# Patient Record
Sex: Male | Born: 1960 | Race: White | Hispanic: No | Marital: Single | Smoking: Never smoker
Health system: Southern US, Community
[De-identification: ages and names within clinical notes are randomized; demographics above are authoritative.]

## PROBLEM LIST (undated history)

## (undated) DIAGNOSIS — K449 Diaphragmatic hernia without obstruction or gangrene: Secondary | ICD-10-CM

## (undated) DIAGNOSIS — G4730 Sleep apnea, unspecified: Secondary | ICD-10-CM

## (undated) DIAGNOSIS — E785 Hyperlipidemia, unspecified: Secondary | ICD-10-CM

## (undated) DIAGNOSIS — K222 Esophageal obstruction: Secondary | ICD-10-CM

## (undated) DIAGNOSIS — K219 Gastro-esophageal reflux disease without esophagitis: Secondary | ICD-10-CM

## (undated) DIAGNOSIS — I493 Ventricular premature depolarization: Secondary | ICD-10-CM

## (undated) DIAGNOSIS — K635 Polyp of colon: Secondary | ICD-10-CM

## (undated) DIAGNOSIS — E669 Obesity, unspecified: Secondary | ICD-10-CM

## (undated) DIAGNOSIS — K5790 Diverticulosis of intestine, part unspecified, without perforation or abscess without bleeding: Secondary | ICD-10-CM

## (undated) DIAGNOSIS — G4733 Obstructive sleep apnea (adult) (pediatric): Secondary | ICD-10-CM

## (undated) HISTORY — DX: Diaphragmatic hernia without obstruction or gangrene: K44.9

## (undated) HISTORY — DX: Gastro-esophageal reflux disease without esophagitis: K21.9

## (undated) HISTORY — PX: OTHER SURGICAL HISTORY: SHX169

## (undated) HISTORY — DX: Ventricular premature depolarization: I49.3

## (undated) HISTORY — DX: Sleep apnea, unspecified: G47.30

## (undated) HISTORY — DX: Obesity, unspecified: E66.9

## (undated) HISTORY — DX: Obstructive sleep apnea (adult) (pediatric): G47.33

## (undated) HISTORY — DX: Polyp of colon: K63.5

## (undated) HISTORY — DX: Hyperlipidemia, unspecified: E78.5

## (undated) HISTORY — DX: Esophageal obstruction: K22.2

## (undated) HISTORY — DX: Diverticulosis of intestine, part unspecified, without perforation or abscess without bleeding: K57.90

---

## 2002-02-23 ENCOUNTER — Encounter: Payer: Self-pay | Admitting: *Deleted

## 2002-02-23 ENCOUNTER — Encounter: Admission: RE | Admit: 2002-02-23 | Discharge: 2002-02-23 | Payer: Self-pay | Admitting: *Deleted

## 2003-02-24 ENCOUNTER — Encounter: Admission: RE | Admit: 2003-02-24 | Discharge: 2003-02-24 | Payer: Self-pay | Admitting: *Deleted

## 2003-02-24 ENCOUNTER — Encounter: Payer: Self-pay | Admitting: *Deleted

## 2005-07-02 ENCOUNTER — Ambulatory Visit: Payer: Self-pay | Admitting: Gastroenterology

## 2006-02-18 ENCOUNTER — Ambulatory Visit: Payer: Self-pay | Admitting: Gastroenterology

## 2006-02-21 ENCOUNTER — Ambulatory Visit: Payer: Self-pay | Admitting: Gastroenterology

## 2007-10-14 ENCOUNTER — Ambulatory Visit (HOSPITAL_BASED_OUTPATIENT_CLINIC_OR_DEPARTMENT_OTHER): Admission: RE | Admit: 2007-10-14 | Discharge: 2007-10-14 | Payer: Self-pay | Admitting: Orthopedic Surgery

## 2007-10-14 ENCOUNTER — Encounter (INDEPENDENT_AMBULATORY_CARE_PROVIDER_SITE_OTHER): Payer: Self-pay | Admitting: Orthopedic Surgery

## 2007-11-13 ENCOUNTER — Encounter: Admission: RE | Admit: 2007-11-13 | Discharge: 2007-11-13 | Payer: Self-pay | Admitting: *Deleted

## 2010-12-12 NOTE — Op Note (Signed)
NAME:  Ptacek, Lonell                 ACCOUNT NO.:  [id number]   MEDICAL RECORD NO.:  [id number]          PATIENT TYPE:  AMB   LOCATION:  DSC                          FACILITY:  [facility]   PHYSICIAN:  Katy Fitch. Sypher, M.D. DATE OF BIRTH:  Dec 28, 1960   DATE OF PROCEDURE:  10/14/2007  DATE OF DISCHARGE:                               OPERATIVE REPORT   PREOPERATIVE DIAGNOSIS:  Mass palmar aspect right index finger PIP  flexion crease, rule out epidermal inclusion cyst versus early  Dupuytren's fibromatosis.   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   OPERATION:  Excisional biopsy of mass right index finger PIP flexion  crease.   INDICATIONS:  Raymie Giammarco is a 50 year old gentleman referred for  evaluation of a mass in the palmar aspect of his right index finger.  He  has  a positive family history of Dupuytren's disease affecting his  father.  He noted this mass and watched it enlarge during the past  several months. He requested an excisional biopsy for diagnosis.  Inspection of his hand revealed no other signs of palmar fibromatosis.  He could not recall any penetrating injury to this region.  After  informed consent, he is brought to the minor operating room for biopsy.   PROCEDURE:  Krystal Delduca is brought to the minor operating room and  placed in the supine position on the operating table.  Following  informed consent, a 2% lidocaine metacarpal head level block was placed  without complication.  The right hand was then prepped with Betadine  soap solution and sterilely draped.  A pneumatic tourniquet was applied  to the proximal brachium.  Following exsanguination of the limb by  direct compression, the arterial tourniquet was inflated to 240 mmHg.  The procedure commenced with an elliptical excision of skin overlying  the mass.  The subcutaneous tissues were carefully divided revealing a  very firm mass of fibrotic tissue.  This was carefully dissected off the  flexor  sheath protecting the neurovascular bundles.  The entire mass was  sent for pathologic evaluation in formalin.  The wound was then repaired  with a simple suture of 5-0 nylon.   For aftercare, Mr. Compean was placed in a compressive dressing with  Xeroflow, sterile gauze, and Coban.  He is provided additional Coban to  change his dressing at home as needed.  We will him see back in follow  up in one week for suture removal.  At that time, we will discuss his  pathologic findings.      Katy Fitch Sypher, M.D.  Electronically Signed     RVS/MEDQ  D:  10/14/2007  T:  10/14/2007  Job:  [id number]

## 2011-03-14 ENCOUNTER — Encounter: Payer: Self-pay | Admitting: Gastroenterology

## 2011-03-20 ENCOUNTER — Other Ambulatory Visit: Payer: Self-pay | Admitting: Family Medicine

## 2011-03-20 DIAGNOSIS — M5412 Radiculopathy, cervical region: Secondary | ICD-10-CM

## 2011-03-22 ENCOUNTER — Ambulatory Visit
Admission: RE | Admit: 2011-03-22 | Discharge: 2011-03-22 | Disposition: A | Discharge status: Home / Self Care | Payer: 59 | Source: Ambulatory Visit | Attending: Family Medicine | Admitting: Family Medicine

## 2011-03-22 DIAGNOSIS — M5412 Radiculopathy, cervical region: Secondary | ICD-10-CM

## 2011-08-20 ENCOUNTER — Encounter: Payer: Self-pay | Admitting: Internal Medicine

## 2011-09-14 ENCOUNTER — Encounter: Payer: Self-pay | Admitting: Internal Medicine

## 2011-09-14 ENCOUNTER — Ambulatory Visit (INDEPENDENT_AMBULATORY_CARE_PROVIDER_SITE_OTHER): Payer: 59 | Admitting: Internal Medicine

## 2011-09-14 VITALS — BP 138/74 | HR 60 | Ht 74.0 in | Wt 226.4 lb

## 2011-09-14 DIAGNOSIS — Z8371 Family history of colonic polyps: Secondary | ICD-10-CM

## 2011-09-14 DIAGNOSIS — Z800 Family history of malignant neoplasm of digestive organs: Secondary | ICD-10-CM

## 2011-09-14 DIAGNOSIS — K222 Esophageal obstruction: Secondary | ICD-10-CM

## 2011-09-14 DIAGNOSIS — Z86010 Personal history of colonic polyps: Secondary | ICD-10-CM

## 2011-09-14 DIAGNOSIS — K219 Gastro-esophageal reflux disease without esophagitis: Secondary | ICD-10-CM

## 2011-09-14 MED ORDER — PEG-KCL-NACL-NASULF-NA ASC-C 100 G PO SOLR: 1.0000 | Freq: Once | ORAL | Status: DC

## 2011-09-14 NOTE — Progress Notes (Signed)
HISTORY OF PRESENT ILLNESS:  Jerry Barry is a 51 y.o. male with a history of gastroesophageal reflux disease complicated by peptic stricture and colon polyps. The patient presents today regarding the desire for surveillance colonoscopy as well as questions regarding management of his reflux disease. He was previously a patient of Dr. Terrial Rhodes. Review of the previous records shows that he has undergone prior upper endoscopies with esophageal dilation. He is maintained on Prilosec 10 mg every other day. On this dose to PPI he has good control of reflux symptoms and no recurrent dysphagia. Off medication, he reports incapacitating heartburn. He does have concerns regarding long-term use of PPI. In particular, bone density issues. He tells me that he has had bone density scan showing some decreased bone density which is being addressed by his physician. Next, he has a family history of colon cancer in a grandparent. Adenomatous colon polyps in his father at age 9. Sister with "40 polyps". His index complete colonoscopy in July of 2007 revealed left-sided diverticulosis, and a diminutive transverse colon polyp which was destroyed with cauterization. No tissue available for pathologic analysis. His recall colonoscopy came out this summer. His chart was reviewed by one of my colleagues, upon Dr. Blossom Hoops retirement. The recall as modified to 2017. Patient is concerned based on his history, and politely questions this recommendation. He denies any lower GI complaints such as change in bowel habits or bleeding.  REVIEW OF SYSTEMS:  All non-GI ROS assessed and reported to be negative.  Past Medical History  Diagnosis Date  . GERD (gastroesophageal reflux disease)   . Hiatal hernia   . Esophageal stricture   . Colon polyps   . Diverticulosis   . Hemorrhoids     Past Surgical History  Procedure Date  . Arm fx     right arm , 6 th grade     Social History Jerry Barry  reports that he has  never smoked. He has never used smokeless tobacco. He reports that he drinks alcohol. He reports that he does not use illicit drugs.  family history includes Benign prostatic hyperplasia in his father; Colitis in his mother; Colon cancer in his maternal grandfather; Colon polyps in his brother, father, and sister; Diabetes in his father; and Heart disease in his mother.  No Known Allergies     PHYSICAL EXAMINATION: Vital signs: BP 138/74  Pulse 60  Ht 6\' 2"  (1.88 m)  Wt 226 lb 6.4 oz (102.694 kg)  BMI 29.07 kg/m2  Constitutional: generally well-appearing, no acute distress Psychiatric: alert and oriented x3, cooperative Eyes: extraocular movements intact, anicteric, conjunctiva pink Mouth: oral pharynx moist, no lesions Neck: supple no lymphadenopathy Cardiovascular: heart regular rate and rhythm, no murmur Lungs: clear to auscultation bilaterally Abdomen: soft, nontender, nondistended, no obvious ascites, no peritoneal signs, normal bowel sounds, no organomegaly Rectal:deferred until colonoscopy Extremities: no lower extremity edema bilaterally Skin: no lesions on visible extremities Neuro: No focal deficits.   ASSESSMENT:  #1. GERD complicated by peptic stricture. Doing well on low-dose PPI. We discussed the issues surrounding bone density and PPI therapy. I reviewed with him the data available at this time. His circumstance (complicated reflux disease) she would be best served continuing low-dose PPI and having his bone density monitored with his physician. This can be treated accordingly. He didn't require about antireflux surgery. We discussed the pros and cons of reflux surgery. He will continue on PPI therapy and reflux precautions. #2. Family history of colon cancer second  degree relative. History of adenomatous polyps in 2 first-degree relatives at an early age #3. Personal history of colon polyp. Type unknown. Greater than 5 years since his last procedure. He is interested in  proceeding with surveillance colonoscopy. I think this is reasonable given his personal history and family history.The nature of the procedure, as well as the risks, benefits, and alternatives were carefully and thoroughly reviewed with the patient. Ample time for discussion and questions allowed. The patient understood, was satisfied, and agreed to proceed. Movi prep prescribed. The patient instructed on it's use

## 2011-09-14 NOTE — Patient Instructions (Addendum)
You have been scheduled for a colonoscopy with propofol. Please follow written instructions given to you at your visit today.  Please pick up your prep kit at the pharmacy within the next 1-3 days.  

## 2011-09-17 ENCOUNTER — Encounter: Payer: Self-pay | Admitting: Internal Medicine

## 2011-09-24 ENCOUNTER — Encounter: Payer: 59 | Admitting: Internal Medicine

## 2011-10-23 ENCOUNTER — Encounter: Payer: Self-pay | Admitting: Internal Medicine

## 2011-10-23 ENCOUNTER — Ambulatory Visit (AMBULATORY_SURGERY_CENTER): Payer: 59 | Admitting: Internal Medicine

## 2011-10-23 VITALS — BP 119/78 | HR 71 | Temp 98.2°F | Resp 18 | Ht 74.0 in | Wt 226.0 lb

## 2011-10-23 DIAGNOSIS — Z800 Family history of malignant neoplasm of digestive organs: Secondary | ICD-10-CM

## 2011-10-23 DIAGNOSIS — Z86010 Personal history of colonic polyps: Secondary | ICD-10-CM

## 2011-10-23 DIAGNOSIS — Z1211 Encounter for screening for malignant neoplasm of colon: Secondary | ICD-10-CM

## 2011-10-23 MED ORDER — SODIUM CHLORIDE 0.9 % IV SOLN: 500.0000 mL | INTRAVENOUS | Status: DC

## 2011-10-23 NOTE — Progress Notes (Signed)
Patient did not experience any of the following events: a burn prior to discharge; a fall within the facility; wrong site/side/patient/procedure/implant event; or a hospital transfer or hospital admission upon discharge from the facility. (G8907) Patient did not have preoperative order for IV antibiotic SSI prophylaxis. (G8918)  

## 2011-10-23 NOTE — Progress Notes (Deleted)
Zofran 2mg . In 8 ml of normal saline x2 given over 10 minutes. [id number])

## 2011-10-23 NOTE — Patient Instructions (Signed)

## 2011-10-23 NOTE — Op Note (Signed)
[facility] [address] [place], [place]  [address]  COLONOSCOPY PROCEDURE REPORT  PATIENT:  Jerry, Barry  MR#:  [id number] BIRTHDATE:  [birth year], 51 yrs. old  GENDER:  male ENDOSCOPIST:  Wilhemina Bonito. Eda Keys, MD REF. BY:  Office PROCEDURE DATE:  10/23/2011 PROCEDURE:  Surveillance Colonoscopy ASA CLASS:  Class II INDICATIONS:  history of polyps, family history of colon cancer, family Hx of polyps ; last exam 01-2006 (small polyp - no path); GP w/ CRC; Father w/ adenomas < 60 yrs; sister with multiple adenomas  MEDICATIONS:   MAC sedation, administered by CRNA, propofol (Diprivan) 300 mg IV  DESCRIPTION OF PROCEDURE:   After the risks benefits and alternatives of the procedure were thoroughly explained, informed consent was obtained.  Digital rectal exam was performed and revealed no abnormalities.   The LB CF-[id number] endoscope was introduced through the anus and advanced to the cecum, which was identified by both the appendix and ileocecal valve, without limitations.  The quality of the prep was excellent, using MoviPrep.  The instrument was then slowly withdrawn as the colon was fully examined. <<PROCEDUREIMAGES>>  FINDINGS:  A normal appearing cecum, ileocecal valve, and appendiceal orifice were identified. The ascending, hepatic flexure, transverse, splenic flexure, descending, sigmoid colon, and rectum appeared unremarkable.  No polyps or cancers were seen. Retroflexed views in the rectum revealed no abnormalities.  The time to cecum = 3:30  minutes. The scope was then withdrawn in 8:19  minutes from the cecum and the procedure completed.  COMPLICATIONS:  None  ENDOSCOPIC IMPRESSION: 1) Normal colon 2) No polyps or cancers  RECOMMENDATIONS: 1) Follow up colonoscopy in 5 years  ______________________________ Wilhemina Bonito. Eda Keys, MD  CC:  Gildardo Cranker, MD; The Patient  n. eSIGNED:   Wilhemina Bonito. Eda Keys at 10/23/2011 02:24 PM  Eugenia Mcalpine,  [phone]

## 2011-10-24 ENCOUNTER — Telehealth: Payer: Self-pay | Admitting: *Deleted

## 2011-10-24 NOTE — Telephone Encounter (Signed)
Left message on number given yesterday to call if needs anything or has questions or problems. ewm

## 2011-12-26 ENCOUNTER — Other Ambulatory Visit: Payer: Self-pay | Admitting: Family Medicine

## 2011-12-26 ENCOUNTER — Ambulatory Visit
Admission: RE | Admit: 2011-12-26 | Discharge: 2011-12-26 | Disposition: A | Discharge status: Home / Self Care | Payer: 59 | Source: Ambulatory Visit | Attending: Family Medicine | Admitting: Family Medicine

## 2011-12-26 DIAGNOSIS — M5126 Other intervertebral disc displacement, lumbar region: Secondary | ICD-10-CM

## 2012-03-20 DIAGNOSIS — Z8042 Family history of malignant neoplasm of prostate: Secondary | ICD-10-CM | POA: Insufficient documentation

## 2012-03-20 DIAGNOSIS — N400 Enlarged prostate without lower urinary tract symptoms: Secondary | ICD-10-CM | POA: Insufficient documentation

## 2013-03-23 DIAGNOSIS — N529 Male erectile dysfunction, unspecified: Secondary | ICD-10-CM | POA: Insufficient documentation

## 2014-01-04 DIAGNOSIS — N411 Chronic prostatitis: Secondary | ICD-10-CM | POA: Insufficient documentation

## 2014-04-19 DIAGNOSIS — R002 Palpitations: Secondary | ICD-10-CM | POA: Insufficient documentation

## 2014-05-06 ENCOUNTER — Encounter: Payer: Self-pay | Admitting: *Deleted

## 2014-05-26 ENCOUNTER — Ambulatory Visit (INDEPENDENT_AMBULATORY_CARE_PROVIDER_SITE_OTHER): Payer: 59 | Admitting: Cardiology

## 2014-05-26 ENCOUNTER — Encounter: Payer: Self-pay | Admitting: Cardiology

## 2014-05-26 VITALS — BP 118/84 | HR 68 | Ht 74.0 in | Wt 216.0 lb

## 2014-05-26 DIAGNOSIS — R002 Palpitations: Secondary | ICD-10-CM

## 2014-05-26 DIAGNOSIS — R0789 Other chest pain: Secondary | ICD-10-CM

## 2014-05-26 LAB — BASIC METABOLIC PANEL
BUN: 14 mg/dL (ref 6–23)
CALCIUM: 9.2 mg/dL (ref 8.4–10.5)
CO2: 28 meq/L (ref 19–32)
CREATININE: 1.1 mg/dL (ref 0.4–1.5)
Chloride: 103 mEq/L (ref 96–112)
GFR: 77.18 mL/min (ref >60.00)
GLUCOSE: 79 mg/dL (ref 70–99)
Potassium: 3.7 mEq/L (ref 3.5–5.1)
SODIUM: 139 meq/L (ref 135–145)

## 2014-05-26 LAB — CBC
HCT: 45.7 % (ref 39.0–52.0)
Hemoglobin: 15.2 g/dL (ref 13.0–17.0)
MCHC: 33.3 g/dL (ref 30.0–36.0)
MCV: 89.3 fl (ref 78.0–100.0)
Platelets: 250 10*3/uL (ref 150.0–400.0)
RBC: 5.11 Mil/uL (ref 4.22–5.81)
RDW: 13.2 % (ref 11.5–15.5)
WBC: 6.9 10*3/uL (ref 4.0–10.5)

## 2014-05-26 LAB — TSH: TSH: 1.27 u[IU]/mL (ref 0.35–4.50)

## 2014-05-26 NOTE — Progress Notes (Signed)
[name]. [address], [address], [place]  [address] Phone: [phone] Fax:  [phone]  Date:  05/26/2014   ID:  Jerry Barry, DOB [birth year], MRN [id number]  PCP:   Melinda Crutch, MD   History of Present Illness: Jerry Barry is a 53 y.o. male here for the evaluation of palpitations.  Never previously had any cardiovascular issues (I took care of his mother Jerry Barry who unfortunately passed in 2014). He was at the [facility] area show, hot day, sweated and on the drive home began to feel his heart racing, uneasy feeling. He checked his pulse, carotid pulse and it seemed to be fast, slightly irregular at 150 bpm. He pulled over at gas station, drink a full Gatorade and water back-to-back and after approximately 30 minutes this feeling subsided. He then had another episode which woke him up from sleep where his heart felt like it was racing. He felt a uneasy feeling over his left chest, mild pressure-like sensation. Prior to these episodes, he was exercising at the gym on treadmill for 45 minutes able to achieve a heart rate of 130 bpm without any difficulty. He is also felt a sensation of a singular skipping beat, PVC like as well in the evening hours. He has cut out all caffeine, cut out alcohol on the weekend, stopped his vitamin B supplement.  Once the event is gone, he feels fine. He is quite anxious about these episodes. These episodes lasted about 30 minutes. Nonsmoker, no prior cardiac history. No early family history of coronary artery disease.   Wt Readings from Last 3 Encounters:  05/26/14 216 lb (97.977 kg)  10/23/11 226 lb (102.513 kg)  09/14/11 226 lb 6.4 oz (102.694 kg)     Past Medical History  Diagnosis Date  . GERD (gastroesophageal reflux disease)   . Hiatal hernia   . Esophageal stricture   . Colon polyps   . Diverticulosis   . Hemorrhoids     Past Surgical History  Procedure Laterality Date  . Arm fx      right arm , 6 th grade     Current  Outpatient Prescriptions  Medication Sig Dispense Refill  . B Complex Vitamins (B COMPLEX PO) as directed      . calcium carbonate 200 MG capsule Take 250 mg by mouth 2 (two) times daily with a meal.      . MULTIPLE VITAMIN PO Take by mouth.      . Omega-3 Fatty Acids (FISH OIL) 1000 MG CAPS Take by mouth.      Marland Kitchen omeprazole (PRILOSEC) 10 MG capsule Take 10 mg by mouth every other day.       No current facility-administered medications for this visit.    Allergies:   No Known Allergies  Social History:  The patient  reports that he has never smoked. He has never used smokeless tobacco. He reports that he drinks alcohol. He reports that he does not use illicit drugs.   Family History  Problem Relation Age of Onset  . Colon polyps Father   . Diabetes Father   . Benign prostatic hyperplasia Father   . Colon cancer Maternal Grandfather   . Colon polyps Sister   . Colon polyps Brother   . Heart disease Mother     stint  . Colitis Mother   . Esophageal cancer Neg Hx   . Rectal cancer Neg Hx   . Stomach cancer Neg Hx  ROS:  Please see the history of present illness.   Denies any syncope, bleeding, orthopnea, PND, strokelike symptoms   All other systems reviewed and negative.   PHYSICAL EXAM: VS:  BP 118/84  Pulse 68  Ht 6\' 2"  (1.88 m)  Wt 216 lb (97.977 kg)  BMI 27.72 kg/m2 Well nourished, well developed, in no acute distress HEENT: normal, [place]/AT, EOMI Neck: no JVD, normal carotid upstroke, no bruit Cardiac:  normal S1, S2; RRR; no murmur Lungs:  clear to auscultation bilaterally, no wheezing, rhonchi or rales Abd: soft, nontender, no hepatomegaly, no bruits Ext: no edema, 2+ distal pulses Skin: warm and dry GU: deferred Neuro: no focal abnormalities noted, AAO x 3  EKG:  05/26/14-sinus rhythm, 68, no other abnormalities.     ASSESSMENT AND PLAN:  1. Palpitations-I will check an event monitor. Differential includes atrial fibrillation, paroxysmal atrial tachycardia,  premature ventricular contractions. Continue to avoid caffeine, Sudafed, excessive alcohol. I will check a TSH, basic metabolic profile and CBC. I would like to gather a diagnosis first before empirically treating. We may use low-dose beta blocker or calcium channel blocker in the future. 2. Mild chest pressure-I will check an echocardiogram to ensure proper structure and function of his heart. Prior to these episodes, he was exercising well without any anginal symptoms. If chest discomfort returns, we will likely consider exercise treadmill test. 3. I will see him back in 6 weeks. He knows to call me if any worrisome symptoms develop.  Signed, Candee Furbish, MD [facility]  05/26/2014 3:10 PM

## 2014-05-26 NOTE — Patient Instructions (Addendum)
Your physician recommends that you continue on your current medications as directed. Please refer to the Current Medication list given to you today. Your physician recommends that you return for lab work today (TSH, BMET, CBC)  Your physician has requested that you have an echocardiogram. Echocardiography is a painless test that uses sound waves to create images of your heart. It provides your doctor with information about the size and shape of your heart and how well your heart's chambers and valves are working. This procedure takes approximately one hour. There are no restrictions for this procedure.  Your physician has recommended that you wear an event monitor. Event monitors are medical devices that record the heart's electrical activity. Doctors most often [place] these monitors to diagnose arrhythmias. Arrhythmias are problems with the speed or rhythm of the heartbeat. The monitor is a small, portable device. You can wear one while you do your normal daily activities. This is usually used to diagnose what is causing palpitations/syncope (passing out).  Your physician recommends that you schedule a follow-up appointment in: ABOUT 6 WEEKS WITH DR. Marlou Porch.

## 2014-05-27 ENCOUNTER — Ambulatory Visit (HOSPITAL_COMMUNITY): Payer: 59 | Attending: Cardiology | Admitting: Radiology

## 2014-05-27 ENCOUNTER — Encounter: Payer: Self-pay | Admitting: *Deleted

## 2014-05-27 ENCOUNTER — Encounter (INDEPENDENT_AMBULATORY_CARE_PROVIDER_SITE_OTHER): Payer: 59

## 2014-05-27 DIAGNOSIS — R002 Palpitations: Secondary | ICD-10-CM

## 2014-05-27 DIAGNOSIS — R0789 Other chest pain: Secondary | ICD-10-CM | POA: Diagnosis not present

## 2014-05-27 NOTE — Progress Notes (Signed)
Echocardiogram performed.  

## 2014-05-27 NOTE — Progress Notes (Signed)
Patient ID: Jerry Barry, male   DOB: Jan 24, 1961, 53 y.o.   MRN: [id number] [employer] 30 day cardiac event monitor applied to patient.

## 2014-06-12 ENCOUNTER — Telehealth: Payer: Self-pay | Admitting: Internal Medicine

## 2014-06-12 NOTE — Telephone Encounter (Signed)
Called from [place] that his monitor recorded an episode of atrial fibrillation. It is currently in SR.

## 2014-06-14 ENCOUNTER — Telehealth: Payer: Self-pay | Admitting: *Deleted

## 2014-06-14 NOTE — Telephone Encounter (Signed)
Reviewed event monitor results with pt who is agreeable to come in on Wednesday to review in detail along with treatment options.  His appt is for 9 am

## 2014-06-16 ENCOUNTER — Ambulatory Visit (INDEPENDENT_AMBULATORY_CARE_PROVIDER_SITE_OTHER): Payer: 59 | Admitting: Cardiology

## 2014-06-16 ENCOUNTER — Ambulatory Visit (INDEPENDENT_AMBULATORY_CARE_PROVIDER_SITE_OTHER)
Admission: RE | Admit: 2014-06-16 | Discharge: 2014-06-16 | Disposition: A | Discharge status: Home / Self Care | Payer: Self-pay | Source: Ambulatory Visit | Attending: Cardiology | Admitting: Cardiology

## 2014-06-16 ENCOUNTER — Encounter: Payer: Self-pay | Admitting: Cardiology

## 2014-06-16 VITALS — BP 128/82 | HR 73 | Ht 74.0 in | Wt 215.0 lb

## 2014-06-16 DIAGNOSIS — R0789 Other chest pain: Secondary | ICD-10-CM

## 2014-06-16 DIAGNOSIS — I480 Paroxysmal atrial fibrillation: Secondary | ICD-10-CM | POA: Insufficient documentation

## 2014-06-16 MED ORDER — DILTIAZEM HCL ER COATED BEADS 120 MG PO CP24: 120.0000 mg | ORAL_CAPSULE | Freq: Every day | ORAL | Status: DC

## 2014-06-16 NOTE — Progress Notes (Signed)
[place]. [address] [place], [place]  [address] Phone: [phone] Fax:  [phone]  Date:  06/16/2014   ID:  Jerry Barry, DOB 01/18/1961, MRN [id number]  PCP:   Melinda Crutch, MD   History of Present Illness: Jerry Barry is a 53 y.o. male here for follow-up, monitor which demonstrated paroxysmal atrial fibrillation. TSH normal, electrolytes normal. CHADS-Vasc -0. See below for description of original symptoms in October 2015.   Never previously had any cardiovascular issues (I took care of his mother Jerry Barry who unfortunately passed in 2014). He was at the [facility] area show, hot day, sweated and on the drive home began to feel his heart racing, uneasy feeling. He checked his pulse, carotid pulse and it seemed to be fast, slightly irregular at 150 bpm. He pulled over at gas station, drink a full Gatorade and water back-to-back and after approximately 30 minutes this feeling subsided. He then had another episode which woke him up from sleep where his heart felt like it was racing. He felt a uneasy feeling over his left chest, mild pressure-like sensation. Prior to these episodes, he was exercising at the gym on treadmill for 45 minutes able to achieve a heart rate of 130 bpm without any difficulty. He is also felt a sensation of a singular skipping beat, PVC like as well in the evening hours. He has cut out all caffeine, cut out alcohol on the weekend, stopped his vitamin B supplement.  Once the event is gone, he feels fine. He is quite anxious about these episodes. These episodes lasted about 30 minutes. Nonsmoker, no prior cardiac history. No early family history of coronary artery disease.   Wt Readings from Last 3 Encounters:  06/16/14 215 lb (97.523 kg)  05/26/14 216 lb (97.977 kg)  10/23/11 226 lb (102.513 kg)     Past Medical History  Diagnosis Date  . GERD (gastroesophageal reflux disease)   . Hiatal hernia   . Esophageal stricture   . Colon polyps   .  Diverticulosis   . Hemorrhoids     Past Surgical History  Procedure Laterality Date  . Arm fx      right arm , 6 th grade     Current Outpatient Prescriptions  Medication Sig Dispense Refill  . B Complex Vitamins (B COMPLEX PO) as directed    . MULTIPLE VITAMIN PO Take by mouth.    Marland Kitchen omeprazole (PRILOSEC) 10 MG capsule Take 10 mg by mouth every other day.     No current facility-administered medications for this visit.    Allergies:   No Known Allergies  Social History:  The patient  reports that he has never smoked. He has never used smokeless tobacco. He reports that he drinks alcohol. He reports that he does not use illicit drugs.   Family History  Problem Relation Age of Onset  . Colon polyps Father   . Diabetes Father   . Benign prostatic hyperplasia Father   . Colon cancer Maternal Grandfather   . Colon polyps Sister   . Colon polyps Brother   . Heart disease Mother     stint  . Colitis Mother   . Esophageal cancer Neg Hx   . Rectal cancer Neg Hx   . Stomach cancer Neg Hx     ROS:  Please see the history of present illness.   Denies any syncope, bleeding, orthopnea, PND, strokelike symptoms   All other systems reviewed and negative.  PHYSICAL EXAM: VS:  BP 128/82 mmHg  Pulse 73  Ht 6\' 2"  (1.88 m)  Wt 215 lb (97.523 kg)  BMI 27.59 kg/m2 Well nourished, well developed, in no acute distress HEENT: normal, [place]/AT, EOMI Neck: no JVD, normal carotid upstroke, no bruit Cardiac:  normal S1, S2; RRR; no murmur Lungs:  clear to auscultation bilaterally, no wheezing, rhonchi or rales Abd: soft, nontender, no hepatomegaly, no bruits Ext: no edema, 2+ distal pulses Skin: warm and dry GU: deferred Neuro: no focal abnormalities noted, AAO x 3  EKG:  05/26/14-sinus rhythm, 68, no other abnormalities.     Echocardiogram: 05/27/14 - Left ventricle: The cavity size was normal. Systolic function was vigorous. The estimated ejection fraction was in the range of  65% to 70%. Wall motion was normal; there were no regional wall motion abnormalities. Left ventricular diastolic function parameters were normal. - Aortic valve: Trileaflet; normal thickness leaflets. There was no regurgitation. - Aortic root: The aortic root was normal in size. - Left atrium: The atrium was mildly dilated. - Right ventricle: Systolic function was normal. - Right atrium: The atrium was normal in size. - Tricuspid valve: There was mild regurgitation. - Pulmonary arteries: Systolic pressure was within the normal range. - Inferior vena cava: The vessel was normal in size. - Pericardium, extracardiac: There was no pericardial effusion.  ASSESSMENT AND PLAN:  1. Paroxysmal atrial fibrillation-detected on monitor 06/12/14 at 12 AM with heart rate maximum 120 bpm. Continue to avoid caffeine, Sudafed, excessive alcohol. We will go ahead and initiate Cardizem CD 120 mg once a day. Next step would be to add flecainide. We discussed the physiology behind atrial fibrillation. Ablation we discussed. CHADS-Vasc is 0. No anticoagulation needed. Low-dose aspirin is reasonable. We discussed consideration of sleep study. Stress reduction, breathing techniques.  2. Mild chest pressure-Prior to these episodes, he was exercising well without any anginal symptoms. If chest discomfort returns, we will likely consider exercise treadmill test. He is interested in calcium scoring. We will go ahead and order this for him. He understands there is an out-of-pocket cost for this. 3. I will see him back in 4 weeks. He knows to call me if any worrisome symptoms develop.  Signed, Candee Furbish, MD [facility]  06/16/2014 9:26 AM

## 2014-06-16 NOTE — Patient Instructions (Signed)
Please start Diltiazem CD 120 mg once a day. Continue all other medications as listed.  Your physician has requested that you have cardiac CT. Cardiac computed tomography (CT) is a painless test that uses an x-ray machine to take clear, detailed pictures of your heart. For further information please visit [facility]. Please follow instruction sheet as given.  Think about the sleep study and let us know when you are ready to have it done.  Follow up in 1 month with Dr Marlou Porch.

## 2014-07-09 ENCOUNTER — Telehealth: Payer: Self-pay | Admitting: Cardiology

## 2014-07-09 ENCOUNTER — Other Ambulatory Visit: Payer: Self-pay | Admitting: *Deleted

## 2014-07-09 ENCOUNTER — Encounter: Payer: Self-pay | Admitting: Cardiology

## 2014-07-09 ENCOUNTER — Ambulatory Visit (INDEPENDENT_AMBULATORY_CARE_PROVIDER_SITE_OTHER): Payer: 59 | Admitting: Cardiology

## 2014-07-09 VITALS — BP 128/88 | HR 71 | Ht 74.0 in | Wt 218.0 lb

## 2014-07-09 DIAGNOSIS — I4891 Unspecified atrial fibrillation: Secondary | ICD-10-CM

## 2014-07-09 DIAGNOSIS — I480 Paroxysmal atrial fibrillation: Secondary | ICD-10-CM

## 2014-07-09 DIAGNOSIS — R0683 Snoring: Secondary | ICD-10-CM

## 2014-07-09 MED ORDER — DILTIAZEM HCL ER COATED BEADS 240 MG PO CP24: 240.0000 mg | ORAL_CAPSULE | Freq: Every day | ORAL | Status: DC

## 2014-07-09 NOTE — Telephone Encounter (Signed)
Walk In pt Form " Sleep Study" Question Please call pt gave to Pam/KM

## 2014-07-09 NOTE — Progress Notes (Signed)
[facility]. [address] [place], [place]  [address] Phone: [phone] Fax:  [phone]  Date:  07/09/2014   ID:  Jerry Barry, DOB 08-Dec-1960, MRN [id number]  PCP:   Melinda Crutch, MD   History of Present Illness: Jerry Barry is a 53 y.o. male here for follow-up, monitor which demonstrated paroxysmal atrial fibrillation. TSH normal, electrolytes normal. CHADS-Vasc -0. See below for description of original symptoms in October 2015.   Never previously had any cardiovascular issues (I took care of his mother Jerry Barry who unfortunately passed in 2014). He was at the [facility] area show, hot day, sweated and on the drive home began to feel his heart racing, uneasy feeling. He checked his pulse, carotid pulse and it seemed to be fast, slightly irregular at 150 bpm. He pulled over at gas station, drink a full Gatorade and water back-to-back and after approximately 30 minutes this feeling subsided. He then had another episode which woke him up from sleep where his heart felt like it was racing. He felt a uneasy feeling over his left chest, mild pressure-like sensation. Prior to these episodes, he was exercising at the gym on treadmill for 45 minutes able to achieve a heart rate of 130 bpm without any difficulty. He is also felt a sensation of a singular skipping beat, PVC like as well in the evening hours. He has cut out all caffeine, cut out alcohol on the weekend, stopped his vitamin B supplement.  Once the event is gone, he feels fine. He is quite anxious about these episodes. These episodes lasted about 30 minutes. Nonsmoker, no prior cardiac history. No early family history of coronary artery disease.  He is still feeling some episodes after starting low-dose diltiazem.   Wt Readings from Last 3 Encounters:  07/09/14 218 lb (98.884 kg)  06/16/14 215 lb (97.523 kg)  05/26/14 216 lb (97.977 kg)     Past Medical History  Diagnosis Date  . GERD (gastroesophageal reflux disease)     . Hiatal hernia   . Esophageal stricture   . Colon polyps   . Diverticulosis   . Hemorrhoids     Past Surgical History  Procedure Laterality Date  . Arm fx      right arm , 6 th grade     Current Outpatient Prescriptions  Medication Sig Dispense Refill  . B Complex Vitamins (B COMPLEX PO) as directed    . diltiazem (CARDIZEM CD) 120 MG 24 hr capsule Take 1 capsule (120 mg total) by mouth daily. 30 capsule 3  . fluorometholone (FML) 0.1 % ophthalmic suspension     . MULTIPLE VITAMIN PO Take by mouth.    Marland Kitchen omeprazole (PRILOSEC) 10 MG capsule Take 10 mg by mouth every other day.    . trimethoprim-polymyxin b (POLYTRIM) ophthalmic solution   0   No current facility-administered medications for this visit.    Allergies:   No Known Allergies  Social History:  The patient  reports that he has never smoked. He has never used smokeless tobacco. He reports that he drinks alcohol. He reports that he does not use illicit drugs.   Family History  Problem Relation Age of Onset  . Colon polyps Father   . Diabetes Father   . Benign prostatic hyperplasia Father   . Colon cancer Maternal Grandfather   . Colon polyps Sister   . Colon polyps Brother   . Heart disease Mother     stint  .  Colitis Mother   . Esophageal cancer Neg Hx   . Rectal cancer Neg Hx   . Stomach cancer Neg Hx     ROS:  Please see the history of present illness.   Denies any syncope, bleeding, orthopnea, PND, strokelike symptoms   All other systems reviewed and negative.   PHYSICAL EXAM: VS:  BP 128/88 mmHg  Pulse 71  Ht 6\' 2"  (1.88 m)  Wt 218 lb (98.884 kg)  BMI 27.98 kg/m2 Well nourished, well developed, in no acute distress HEENT: normal, [place]/AT, EOMI Neck: no JVD, normal carotid upstroke, no bruit Cardiac:  normal S1, S2; RRR; no murmur Lungs:  clear to auscultation bilaterally, no wheezing, rhonchi or rales Abd: soft, nontender, no hepatomegaly, no bruits Ext: no edema, 2+ distal pulses Skin: warm and  dry GU: deferred Neuro: no focal abnormalities noted, AAO x 3  EKG:  05/26/14-sinus rhythm, 68, no other abnormalities.     Echocardiogram: 05/27/14 - Left ventricle: The cavity size was normal. Systolic function was vigorous. The estimated ejection fraction was in the range of 65% to 70%. Wall motion was normal; there were no regional wall motion abnormalities. Left ventricular diastolic function parameters were normal. - Aortic valve: Trileaflet; normal thickness leaflets. There was no regurgitation. - Aortic root: The aortic root was normal in size. - Left atrium: The atrium was mildly dilated. - Right ventricle: Systolic function was normal. - Right atrium: The atrium was normal in size. - Tricuspid valve: There was mild regurgitation. - Pulmonary arteries: Systolic pressure was within the normal range. - Inferior vena cava: The vessel was normal in size. - Pericardium, extracardiac: There was no pericardial effusion.  ASSESSMENT AND PLAN:  1. Paroxysmal atrial fibrillation-detected on monitor 06/12/14 at 12 AM with heart rate maximum 120 bpm. Continue to avoid caffeine, Sudafed, excessive alcohol. In fact, he thinks that alcohol may be a trigger. He had a glass of wine the other night and felt more palpitations than usual. We will go ahead and increase Cardizem CD 120 mg once a day to 240 mg. Next step would be to add flecainide. We discussed the physiology behind atrial fibrillation. Ablation we discussed. CHADS-Vasc is 0. No anticoagulation needed. Low-dose aspirin is reasonable. We discussed consideration of sleep study. Stress reduction, breathing techniques.  2. Mild chest pressure-Prior to these episodes, he was exercising well without any anginal symptoms. If chest discomfort returns, we will likely consider exercise treadmill test. Calcium scoring-0. 3. I will see him back in 4 months. He knows to call me if any worrisome symptoms develop.  Signed, Candee Furbish,  MD [facility]  07/09/2014 9:17 AM

## 2014-07-09 NOTE — Patient Instructions (Signed)
Please increase your Diltiazem CD to 240 mg a day. Continue all other medications as listed.  Follow up in 4 months with Dr. Marlou Porch.  You will receive a letter in the mail 2 months before you are due.  Please call us when you receive this letter to schedule your follow up appointment.  Thank you for choosing [facility]!!

## 2014-09-28 ENCOUNTER — Ambulatory Visit (HOSPITAL_BASED_OUTPATIENT_CLINIC_OR_DEPARTMENT_OTHER): Payer: 59 | Attending: Cardiology | Admitting: Radiology

## 2014-09-28 VITALS — Ht 74.0 in | Wt 210.0 lb

## 2014-09-28 DIAGNOSIS — I4891 Unspecified atrial fibrillation: Secondary | ICD-10-CM | POA: Diagnosis not present

## 2014-09-28 DIAGNOSIS — G4733 Obstructive sleep apnea (adult) (pediatric): Secondary | ICD-10-CM

## 2014-09-28 DIAGNOSIS — R0683 Snoring: Secondary | ICD-10-CM

## 2014-10-04 ENCOUNTER — Encounter (HOSPITAL_BASED_OUTPATIENT_CLINIC_OR_DEPARTMENT_OTHER): Payer: Self-pay | Admitting: Radiology

## 2014-10-04 ENCOUNTER — Telehealth: Payer: Self-pay | Admitting: Cardiology

## 2014-10-04 DIAGNOSIS — G4733 Obstructive sleep apnea (adult) (pediatric): Secondary | ICD-10-CM

## 2014-10-04 HISTORY — DX: Obstructive sleep apnea (adult) (pediatric): G47.33

## 2014-10-04 NOTE — Telephone Encounter (Signed)
Please let patient know that he has mild OSA and please set up OV with me to discuss options for treatment.

## 2014-10-04 NOTE — Sleep Study (Signed)
   NAME: KHYRAN RIERA DATE OF BIRTH:  12-Feb-1961 MEDICAL RECORD NUMBER [id number]  LOCATION: [facility]  PHYSICIAN: Gissele Narducci R  DATE OF STUDY: 09/28/2014  SLEEP STUDY TYPE: Nocturnal Polysomnogram               REFERRING PHYSICIAN: Candee Furbish, MD  INDICATION FOR STUDY: Snoring and excessive daytime sleepiness  EPWORTH SLEEPINESS SCORE: 0 HEIGHT: 6\' 2"  (188 cm)  WEIGHT: 210 lb (95.255 kg)    Body mass index is 26.95 kg/(m^2).  NECK SIZE: 17 in.  MEDICATIONS: Reviewed in the chart  SLEEP ARCHITECTURE: The patient slept for a total of 305 minutes out of a total sleep period of 353 minutes.  There was no slow wave sleep and 45 minutes of REM sleep.  The onset to sleep maintenance was 23 minutes and onset to REM sleep latency was delayed at 160 minutes.  The sleep efficiency was reduced at 80%.  There were increased frequency of arousals due to respiratory events and spontaneous arousals.    RESPIRATORY DATA: The patient had a total of 3 apneas, of which, 2 were central and 1 obstructive.  There were 36 hypopneas.  Most events occurred in NREM sleep in the supine position.  The AHI was mildly elevated at 7.7 events per hour consistent with mild obstructive sleep apnea/hypopnea syndrome.  There was mild intermittent snoring.  OXYGEN DATA: The lowest oxygen saturation was 88%.    CARDIAC DATA: The patient maintained NSR with PVC's during the study.  MOVEMENT/PARASOMNIA: There were no periodic limb movement disorders or REM sleep behavior disorders.  IMPRESSION/ RECOMMENDATION:   1.  Mild obstructive sleep apnea/hypopnea syndrome with an AHI of 7.7 event per hour.  Most events occurred in the supine position during NREM sleep. 2.  Mild intermittent snoring was noted. 3.  Reduced sleep efficiency with increased frequency of awakenings mainly due to spontaneous arousals.   4.  No significant limb movements were noted. 5.  No significant oxygen desaturations were  noted. 6.  PVC's were noted during the study.   7.  The patient should be counseled on good sleep hygiene. 8.  The patient should be counseled to avoid sleeping supine. 9.  Give the mild degree of apnea once could consider referral to ENT for evaluation of surgical causes of mild OSA with snoring, referral to dentist for evaluation or oral device or consider a trial of CPAP therapy.   Signed: Sueanne Margarita Diplomate, American Board of Sleep Medicine  ELECTRONICALLY SIGNED ON:  10/04/2014, 9:24 AM [place] PH: [phone]   FX: [phone] [place]

## 2014-10-05 NOTE — Telephone Encounter (Signed)
Spoke with patient to let him know that he does have mild OSA. Per Dr. Radford Pax, I  Made an appt for him to come in to discuss treatment options.

## 2014-10-26 DIAGNOSIS — G4719 Other hypersomnia: Secondary | ICD-10-CM | POA: Insufficient documentation

## 2014-10-26 DIAGNOSIS — E669 Obesity, unspecified: Secondary | ICD-10-CM | POA: Insufficient documentation

## 2014-10-26 NOTE — Progress Notes (Signed)
Cardiology Office Note   Date:  10/27/2014   ID:  Jerry Barry, DOB Apr 09, 1961, MRN [id number]  PCP:   Melinda Crutch, MD  Cardiologist:   Sueanne Margarita, MD   Chief Complaint  Patient presents with  . Sleep Apnea  . Obesity      History of Present Illness: Jerry Barry is a 54 y.o. male who presents for evaluation of obstructive sleep apnea.  He recently was referred by Dr. Marlou Porch for sleep study due to snoring and excessive daytime sleepiness as well as PAF and PVC's.  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  He usually sleeps through the night.  He was found to have mild OSA with an AHI of 7.7 events per hour with mild intermittent snoring and no significant oxygen desaturations.  The patient is now here to discuss his options for treatment.    Past Medical History  Diagnosis Date  . GERD (gastroesophageal reflux disease)   . Hiatal hernia   . Esophageal stricture   . Colon polyps   . Diverticulosis   . Hemorrhoids   . OSA (obstructive sleep apnea) 10/04/2014    Mild with AHI 7.7/hr    Past Surgical History  Procedure Laterality Date  . Arm fx      right arm , 6 th grade      Current Outpatient Prescriptions  Medication Sig Dispense Refill  . B Complex Vitamins (B COMPLEX PO) as directed    . diltiazem (CARDIZEM CD) 240 MG 24 hr capsule Take 1 capsule (240 mg total) by mouth daily. 90 capsule 3  . MULTIPLE VITAMIN PO Take by mouth.    Marland Kitchen omeprazole (PRILOSEC) 10 MG capsule Take 10 mg by mouth every other day.     No current facility-administered medications for this visit.    Allergies:   Review of patient's allergies indicates no known allergies.    Social History:  The patient  reports that he has never smoked. He has never used smokeless  tobacco. He reports that he drinks alcohol. He reports that he does not use illicit drugs.   Family History:  The patient's family history includes Benign prostatic hyperplasia in his father; Colitis in his mother; Colon cancer in his maternal grandfather; Colon polyps in his brother, father, and sister; Diabetes in his father; Heart disease in his mother. There is no history of Esophageal cancer, Rectal cancer, or Stomach cancer.    ROS:  Please see the history of present illness.   Otherwise, review of systems are positive for none.   All other systems are reviewed and negative.    PHYSICAL EXAM: VS:  BP 112/52 mmHg  Pulse 67  Ht 6\' 2"  (1.88 m)  Wt 225 lb 6.4 oz (102.241 kg)  BMI 28.93 kg/m2  SpO2 98% , BMI Body mass index is 28.93 kg/(m^2). GEN: Well nourished, well developed, in no acute distress HEENT: normal.  Elongated uvula Neck: no JVD, carotid bruits, or masses Cardiac: RRR; no murmurs, rubs, or gallops,no edema  Respiratory:  clear to auscultation bilaterally, normal work of breathing GI: soft, nontender, nondistended, + BS MS: no deformity or atrophy Skin: warm and dry, no rash Neuro:  Strength and sensation  are intact Psych: euthymic mood, full affect   EKG:  EKG is not ordered today.    Recent Labs: 05/26/2014: BUN 14; Creatinine 1.1; Hemoglobin 15.2; Platelets 250.0; Potassium 3.7; Sodium 139; TSH 1.27    Lipid Panel No results found for: CHOL, TRIG, HDL, CHOLHDL, VLDL, LDLCALC, LDLDIRECT    Wt Readings from Last 3 Encounters:  10/27/14 225 lb 6.4 oz (102.241 kg)  09/28/14 210 lb (95.255 kg)  07/09/14 218 lb (98.884 kg)        ASSESSMENT AND PLAN:  1.  Mild obstructive sleep apnea with AHI 7.7 events per hour.  We have discussed the options for treatment of mild OSA.  I doubt it is causing his PAF given that he did not have any significant oxygen desaturations.  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  He is not interested in trying the oral device due to issues with TMJ.  I will refer him to Dr. Erik Obey for evaluation. 2.  Excessive daytime sleepiness with snoring - encouraged him to practice good sleep hygiene and try to get 8 hours of sleep nightly.  I have also encouraged him to try to avoid sleeping supine since most events occurred in the supine position.   3.  Obesity   Current medicines are reviewed at length with the patient today.  The patient does not have concerns regarding medicines.  The following changes have been made:  no change  Labs/ tests ordered today include: None  No orders of the defined types were placed in this encounter.     Disposition:   FU with me 4 months   Signed, Sueanne Margarita, MD  10/27/2014 8:33 AM    [facility] [place], [place], [place]  [address] Phone: [phone]; Fax: [phone]

## 2014-10-27 ENCOUNTER — Ambulatory Visit (INDEPENDENT_AMBULATORY_CARE_PROVIDER_SITE_OTHER): Payer: 59 | Admitting: Cardiology

## 2014-10-27 ENCOUNTER — Encounter: Payer: Self-pay | Admitting: Cardiology

## 2014-10-27 VITALS — BP 112/52 | HR 67 | Ht 74.0 in | Wt 225.4 lb

## 2014-10-27 DIAGNOSIS — G4719 Other hypersomnia: Secondary | ICD-10-CM | POA: Diagnosis not present

## 2014-10-27 DIAGNOSIS — G4733 Obstructive sleep apnea (adult) (pediatric): Secondary | ICD-10-CM

## 2014-10-27 DIAGNOSIS — E669 Obesity, unspecified: Secondary | ICD-10-CM

## 2014-10-27 NOTE — Patient Instructions (Signed)
You have been referred to Dr. Erik Obey for mild obstructive sleep apnea.  Your physician recommends that you schedule a follow-up appointment in: 3 months with Dr. Radford Pax.

## 2014-11-09 ENCOUNTER — Ambulatory Visit (INDEPENDENT_AMBULATORY_CARE_PROVIDER_SITE_OTHER): Payer: 59 | Admitting: Cardiology

## 2014-11-09 ENCOUNTER — Encounter: Payer: Self-pay | Admitting: Cardiology

## 2014-11-09 VITALS — BP 130/72 | HR 65 | Ht 74.0 in | Wt 225.0 lb

## 2014-11-09 DIAGNOSIS — G4733 Obstructive sleep apnea (adult) (pediatric): Secondary | ICD-10-CM

## 2014-11-09 DIAGNOSIS — I480 Paroxysmal atrial fibrillation: Secondary | ICD-10-CM | POA: Diagnosis not present

## 2014-11-09 NOTE — Progress Notes (Signed)
[facility]. [address] [place], [place]  [address] Phone: [phone] Fax:  [phone]  Date:  11/09/2014   ID:  Jerry Barry, DOB 1960-09-26, MRN [id number]  PCP:   Melinda Crutch, MD   History of Present Illness: Jerry Barry is a 54 y.o. male here for follow-up, monitor which demonstrated paroxysmal atrial fibrillation. TSH normal, electrolytes normal. CHADS-Vasc -0. See below for description of original symptoms in October 2015.   Never previously had any cardiovascular issues (I took care of his mother [name] who unfortunately passed in 2014). He was at the [facility] area show, hot day, sweated and on the drive home began to feel his heart racing, uneasy feeling. He checked his pulse, carotid pulse and it seemed to be fast, slightly irregular at 150 bpm. He pulled over at gas station, drink a full Gatorade and water back-to-back and after approximately 30 minutes this feeling subsided. He then had another episode which woke him up from sleep where his heart felt like it was racing. He felt a uneasy feeling over his left chest, mild pressure-like sensation. Prior to these episodes, he was exercising at the gym on treadmill for 45 minutes able to achieve a heart rate of 130 bpm without any difficulty. He is also felt a sensation of a singular skipping beat, PVC like as well in the evening hours. He has cut out all caffeine, cut out alcohol on the weekend, stopped his vitamin B supplement.  Once the event is gone, he feels fine. He is quite anxious about these episodes. These episodes lasted about 30 minutes. Nonsmoker, no prior cardiac history. No early family history of coronary artery disease.  He is still feeling some episodes after starting low-dose diltiazem.   11/09/14- has felt some episodes of brief arrhythmia. Felt one while watching a movie. Has been tested for sleep apnea and has mild sleep apnea. Is seeing an ENT. We will continue to monitor. When exercising at the gym,  heart rate will increase to 118. Overall feels well. No further episodes of significant chest discomfort. Occasionally stressful situation, traffic for instance may cause some mild fleeting discomfort.   Wt Readings from Last 3 Encounters:  11/09/14 225 lb (102.059 kg)  10/27/14 225 lb 6.4 oz (102.241 kg)  09/28/14 210 lb (95.255 kg)     Past Medical History  Diagnosis Date  . GERD (gastroesophageal reflux disease)   . Hiatal hernia   . Esophageal stricture   . Colon polyps   . Diverticulosis   . Hemorrhoids   . OSA (obstructive sleep apnea) 10/04/2014    Mild with AHI 7.7/hr    Past Surgical History  Procedure Laterality Date  . Arm fx      right arm , 6 th grade     Current Outpatient Prescriptions  Medication Sig Dispense Refill  . B Complex Vitamins (B COMPLEX PO) as directed    . diltiazem (CARDIZEM CD) 240 MG 24 hr capsule Take 1 capsule (240 mg total) by mouth daily. 90 capsule 3  . MULTIPLE VITAMIN PO Take by mouth.    Marland Kitchen omeprazole (PRILOSEC) 10 MG capsule Take 10 mg by mouth every other day.     No current facility-administered medications for this visit.    Allergies:   No Known Allergies  Social History:  The patient  reports that he has never smoked. He has never used smokeless tobacco. He reports that he drinks alcohol. He reports that he  does not use illicit drugs.   Family History  Problem Relation Age of Onset  . Colon polyps Father   . Diabetes Father   . Benign prostatic hyperplasia Father   . Colon cancer Maternal Grandfather   . Colon polyps Sister   . Colon polyps Brother   . Heart disease Mother     stint  . Colitis Mother   . Esophageal cancer Neg Hx   . Rectal cancer Neg Hx   . Stomach cancer Neg Hx     ROS:  Please see the history of present illness.   Denies any syncope, bleeding, orthopnea, PND, strokelike symptoms   +for palpitations. All other systems reviewed and negative.   PHYSICAL EXAM: VS:  BP 130/72 mmHg  Pulse 65  Ht 6'  2" (1.88 m)  Wt 225 lb (102.059 kg)  BMI 28.88 kg/m2 Well nourished, well developed, in no acute distress HEENT: normal, [place]/AT, EOMI Neck: no JVD, normal carotid upstroke, no bruit Cardiac:  normal S1, S2; RRR; no murmur Lungs:  clear to auscultation bilaterally, no wheezing, rhonchi or rales Abd: soft, nontender, no hepatomegaly, no bruits Ext: no edema, 2+ distal pulses Skin: warm and dry GU: deferred Neuro: no focal abnormalities noted, AAO x 3  EKG:  05/26/14-sinus rhythm, 68, no other abnormalities.     Echocardiogram: 05/27/14 - Left ventricle: The cavity size was normal. Systolic function was vigorous. The estimated ejection fraction was in the range of 65% to 70%. Wall motion was normal; there were no regional wall motion abnormalities. Left ventricular diastolic function parameters were normal. - Aortic valve: Trileaflet; normal thickness leaflets. There was no regurgitation. - Aortic root: The aortic root was normal in size. - Left atrium: The atrium was mildly dilated. - Right ventricle: Systolic function was normal. - Right atrium: The atrium was normal in size. - Tricuspid valve: There was mild regurgitation. - Pulmonary arteries: Systolic pressure was within the normal range. - Inferior vena cava: The vessel was normal in size. - Pericardium, extracardiac: There was no pericardial effusion.  ASSESSMENT AND PLAN:  1. Paroxysmal atrial fibrillation-detected on monitor 06/12/14 at 12 AM with heart rate maximum 120 bpm. Continue to avoid caffeine, Sudafed, excessive alcohol. In fact, he thinks that alcohol may be a trigger. He had a glass of wine the other night and felt more palpitations than usual. Dilt 240 mg. Next step would be to add flecainide. We discussed the physiology behind atrial fibrillation. Ablation we discussed. CHADS-Vasc is 0. No anticoagulation needed.  sleep study- OSA. Stress reduction, breathing techniques.  2. Mild chest pressure-Doing  better, no further episodes.  Calcium scoring-0. 3. I will see him back in 6 months. He knows to call me if any worrisome symptoms develop.  Signed, Candee Furbish, MD [facility]  11/09/2014 3:58 PM

## 2014-11-09 NOTE — Patient Instructions (Signed)
The current medical regimen is effective;  continue present plan and medications.  Follow up in 6 months with Dr. Skains.  You will receive a letter in the mail 2 months before you are due.  Please call us when you receive this letter to schedule your follow up appointment.  Thank you for choosing [facility]!!     

## 2015-01-18 ENCOUNTER — Encounter: Payer: Self-pay | Admitting: Cardiology

## 2015-01-18 ENCOUNTER — Ambulatory Visit (INDEPENDENT_AMBULATORY_CARE_PROVIDER_SITE_OTHER): Payer: 59 | Admitting: Cardiology

## 2015-01-18 VITALS — BP 110/70 | HR 61 | Ht 74.0 in | Wt 223.4 lb

## 2015-01-18 DIAGNOSIS — G4719 Other hypersomnia: Secondary | ICD-10-CM | POA: Diagnosis not present

## 2015-01-18 DIAGNOSIS — G4733 Obstructive sleep apnea (adult) (pediatric): Secondary | ICD-10-CM

## 2015-01-18 NOTE — Patient Instructions (Signed)
Medication Instructions:  Your physician recommends that you continue on your current medications as directed. Please refer to the Current Medication list given to you today.   Labwork: None  Testing/Procedures: Dr. Radford Pax recommends you have an in-lab CPAP titration.  Follow-Up: We will call you with the results of your titration and schedule an appointment from there.  Any Other Special Instructions Will Be Listed Below (If Applicable).

## 2015-01-18 NOTE — Progress Notes (Signed)
Cardiology Office Note   Date:  01/18/2015   ID:  Jerry Barry, DOB [birth year], MRN [id number]  PCP:   Melinda Crutch, MD    Chief Complaint  Patient presents with  . Follow-up    OSA      History of Present Illness: Jerry Barry is a 54 y.o. male who presents for followup of obstructive sleep apnea. He recently was referred by Dr. Marlou Porch for sleep study due to snoring and excessive daytime sleepiness as well as PAF and PVC's. It was felt that drinking ETOH in the beginning triggered his PAF but now it can occur for no reason. 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 He usually sleeps through the night. He was found to have mild OSA with an AHI of 7.7 events per hour with mild intermittent snoring and no significant oxygen desaturations. At last OV he was referred to Dr. Erik Obey who said that he had no obvious surgical causes for his snoring or apnea.  He thinks is sleep apnea is worse and is now waking himself up snoring and feels very fatigued during the day.    Past Medical History  Diagnosis Date  . GERD (gastroesophageal reflux disease)   . Hiatal hernia   . Esophageal stricture   . Colon polyps   . Diverticulosis   . Hemorrhoids   . OSA (obstructive sleep apnea) 10/04/2014    Mild with AHI 7.7/hr    Past Surgical History  Procedure Laterality Date  . Arm fx      right arm , 6 th grade      Current Outpatient Prescriptions  Medication Sig Dispense Refill  . B Complex Vitamins (B COMPLEX PO) as directed    . diltiazem (CARDIZEM CD) 240 MG 24 hr capsule Take 1 capsule (240 mg total) by mouth daily. 90 capsule 3  . MULTIPLE VITAMIN PO Take by mouth.    Marland Kitchen omeprazole (PRILOSEC) 10 MG capsule Take 10 mg by mouth every other day.     No current facility-administered medications for this visit.    Allergies:   Review of  patient's allergies indicates no known allergies.    Social History:  The patient  reports that he has never smoked. He has never used smokeless tobacco. He reports that he drinks alcohol. He reports that he does not use illicit drugs.   Family History:  The patient's family history includes Benign prostatic hyperplasia in his father; Colitis in his mother; Colon cancer in his maternal grandfather; Colon polyps in his brother, father, and sister; Diabetes in his father; Heart disease in his mother. There is no history of Esophageal cancer, Rectal cancer, or Stomach cancer.    ROS:  Please see the history of present illness.   Otherwise, review of systems are positive for none.   All other systems are reviewed and negative.    PHYSICAL EXAM: VS:  BP 110/70 mmHg  Pulse 61  Ht 6\' 2"  (1.88 m)  Wt 223 lb 6.4 oz (101.334 kg)  BMI 28.67 kg/m2  SpO2 98% , BMI Body mass index is 28.67 kg/(m^2). GEN: Well nourished, well developed, in no acute distress HEENT: normal Neck: no JVD, carotid bruits, or masses Cardiac: RRR; no murmurs, rubs, or gallops,no  edema  Respiratory:  clear to auscultation bilaterally, normal work of breathing GI: soft, nontender, nondistended, + BS MS: no deformity or atrophy Skin: warm and dry, no rash Neuro:  Strength and sensation are intact Psych: euthymic mood, full affect   EKG:  EKG is not ordered today.    Recent Labs: 05/26/2014: BUN 14; Creatinine, Ser 1.1; Hemoglobin 15.2; Platelets 250.0; Potassium 3.7; Sodium 139; TSH 1.27    Lipid Panel No results found for: CHOL, TRIG, HDL, CHOLHDL, VLDL, LDLCALC, LDLDIRECT    Wt Readings from Last 3 Encounters:  01/18/15 223 lb 6.4 oz (101.334 kg)  11/09/14 225 lb (102.059 kg)  10/27/14 225 lb 6.4 oz (102.241 kg)    ASSESSMENT AND PLAN:  1. Mild obstructive sleep apnea with AHI 7.7 events per hour. We have discussed the options for treatment of mild OSA. ENT evaluation did not reveal any obstructive  airway causes that could be fixed with surgery.  He is having a lot of problems with snoring and feeling tired during the day so I have recommended that we proceed with CPAP titration.   2. Excessive daytime sleepiness with snoring - encouraged him to practice good sleep hygiene and try to get 8 hours of sleep nightly. I have also encouraged him to try to avoid sleeping supine since most events occurred in the supine position.  3. Obesity    Current medicines are reviewed at length with the patient today.  The patient does not have concerns regarding medicines.  The following changes have been made:  no change  Labs/ tests ordered today: See above Assessment and Plan No orders of the defined types were placed in this encounter.     Disposition:   FU with me 10 weeks after starting CPAP  Signed, Sueanne Margarita, MD  01/18/2015 4:15 PM    [facility] [place], [place], [place]  [address] Phone: [phone]; Fax: [phone]

## 2015-04-08 ENCOUNTER — Ambulatory Visit (HOSPITAL_BASED_OUTPATIENT_CLINIC_OR_DEPARTMENT_OTHER): Payer: 59 | Attending: Cardiology

## 2015-04-08 VITALS — Ht 74.0 in | Wt 223.0 lb

## 2015-04-08 DIAGNOSIS — G4733 Obstructive sleep apnea (adult) (pediatric): Secondary | ICD-10-CM | POA: Diagnosis not present

## 2015-04-08 DIAGNOSIS — Z9989 Dependence on other enabling machines and devices: Secondary | ICD-10-CM

## 2015-04-08 DIAGNOSIS — I493 Ventricular premature depolarization: Secondary | ICD-10-CM | POA: Insufficient documentation

## 2015-04-08 DIAGNOSIS — G4730 Sleep apnea, unspecified: Secondary | ICD-10-CM | POA: Diagnosis present

## 2015-04-08 DIAGNOSIS — R0683 Snoring: Secondary | ICD-10-CM | POA: Insufficient documentation

## 2015-04-09 ENCOUNTER — Telehealth: Payer: Self-pay | Admitting: Cardiology

## 2015-04-09 NOTE — Progress Notes (Signed)
     Patient Name: Jerry Barry, Jerry Barry MRN: [id number] Study Date: 04/08/2015 Gender: Male D.O.B: 03-Dec-1960 Age (years): 4 Referring Provider: Fransico Him MD, ABSM Interpreting Physician: Fransico Him MD, ABSM RPSGT: Jonna Coup  Height (inches): 74 Weight (lbs): 223 BMI: 29 Neck Size: 17.00  CLINICAL INFORMATION The patient is referred for a CPAP titration to treat sleep apnea.  Date of NPSG, Split Night or HST:10/04/2014  SLEEP STUDY TECHNIQUE As per the AASM Manual for the Scoring of Sleep and Associated Events v2.3 (April 2016) with a hypopnea requiring 4% desaturations.  The channels recorded and monitored were frontal, central and occipital EEG, electrooculogram (EOG), submentalis EMG (chin), nasal and oral airflow, thoracic and abdominal wall motion, anterior tibialis EMG, snore microphone, electrocardiogram, and pulse oximetry. Continuous positive airway pressure (CPAP) was initiated at the beginning of the study and titrated to treat sleep-disordered breathing.  MEDICATIONS Medications taken by the patient : Cardizem, Prilosec Medications administered by patient during sleep study : No sleep medicine administered.  TECHNICIAN COMMENTS Comments added by technician: NONE  Comments added by scorer: N/A  RESPIRATORY PARAMETERS Optimal PAP Pressure (cm):11  AHI at Optimal Pressure (/hr):0.7 Overall Minimal O2 (%):91.00     Minimal O2 at Optimal Pressure (%):93.0      SLEEP ARCHITECTURE The study was initiated at 10:41:31 PM and ended at 5:00:36 AM.  Sleep onset time was 4.0 minutes and the sleep efficiency was reduced at 80.9%. The total sleep time was 306.5 minutes.  The patient spent 8.48% of the night in stage N1 sleep, 66.39% in stage N2 sleep, 0.65% in stage N3 and 24.47% in REM.Stage REM latency was 98.5 minutes  Wake after sleep onset was 68.6. Alpha intrusion was absent. Supine sleep was 69.14%.  CARDIAC DATA The 2 lead EKG demonstrated sinus rhythm.  The mean heart rate was 55.41 beats per minute. Other EKG findings include: PVCs.  LEG MOVEMENT DATA The total Periodic Limb Movements of Sleep (PLMS) were 34. The PLMS index was 6.66. A PLMS index of <15 is considered normal in adults.  IMPRESSIONS The optimal PAP pressure was 11 cm of water. Central sleep apnea was not noted during this titration (CAI = 0.4/h). Mild oxygen desaturations were observed during this titration (min O2 = 91.00%). The patient snored with Soft snoring volume during this titration study. 2-lead EKG demonstrated: PVCs Mild periodic limb movements were observed during this study. Arousals associated with PLMs were rare.  DIAGNOSIS Obstructive Sleep Apnea (327.23 [G47.33 ICD-10])  RECOMMENDATIONS Trial of CPAP therapy on 11cm H2O with a Medium size Fisher&Paykel Full Face Mask Simplus mask and heated humidification. Avoid alcohol, sedatives and other CNS depressants that may worsen sleep apnea and disrupt normal sleep architecture. Sleep hygiene should be reviewed to assess factors that may improve sleep quality. Weight management and regular exercise should be initiated or continued. Return to [facility] for re-evaluation after 10 weeks of therapy and CPAP download in 4 weeks to assess compliance.     [place], American Board of Sleep Medicine  ELECTRONICALLY SIGNED ON:  04/09/2015, 3:47 PM [place] PH: [phone]   FX: [phone] [place]

## 2015-04-09 NOTE — Telephone Encounter (Signed)
Pt had successful PAP titration. Please setup appointment in 10 weeks. Please let [facility] know that order for PAP is in EPIC.   

## 2015-04-09 NOTE — Addendum Note (Signed)
Addended by: Fransico Him R on: 04/09/2015 03:56 PM   Modules accepted: Orders

## 2015-04-11 NOTE — Telephone Encounter (Signed)
Left message for patient to call back  

## 2015-04-11 NOTE — Telephone Encounter (Signed)
Patient is aware of results. Stated verbal understanding. [place] Notified. Will schedule 10 weeks once he is set up with machine

## 2015-05-20 ENCOUNTER — Encounter: Payer: Self-pay | Admitting: Cardiology

## 2015-06-16 ENCOUNTER — Ambulatory Visit: Payer: 59 | Admitting: Cardiology

## 2015-06-17 ENCOUNTER — Ambulatory Visit (INDEPENDENT_AMBULATORY_CARE_PROVIDER_SITE_OTHER): Payer: 59 | Admitting: Cardiology

## 2015-06-17 ENCOUNTER — Encounter: Payer: Self-pay | Admitting: Cardiology

## 2015-06-17 VITALS — BP 90/66 | HR 72 | Ht 74.0 in | Wt 220.1 lb

## 2015-06-17 DIAGNOSIS — G4733 Obstructive sleep apnea (adult) (pediatric): Secondary | ICD-10-CM

## 2015-06-17 DIAGNOSIS — G4719 Other hypersomnia: Secondary | ICD-10-CM | POA: Diagnosis not present

## 2015-06-17 DIAGNOSIS — E669 Obesity, unspecified: Secondary | ICD-10-CM | POA: Diagnosis not present

## 2015-06-17 HISTORY — DX: Obesity, unspecified: E66.9

## 2015-06-17 NOTE — Progress Notes (Signed)
Cardiology Office Note   Date:  06/17/2015   ID:  Jerry Barry, DOB 07-21-1961, MRN [id number]  PCP:   Melinda Crutch, MD    Chief Complaint  Patient presents with  . Follow-up    sleep apnea      History of Present Illness: Jerry Barry is a 54 y.o. male who presents for followup of obstructive sleep apnea. He recently was referred by Dr. Marlou Porch for sleep study due to snoring and excessive daytime sleepiness as well as PAF and PVC's. He was found to have mild OSA with an AHI of 7.7 events per hour with mild intermittent snoring and no significant oxygen desaturations.He was seen by Dr. Erik Obey who said that he had no obvious surgical causes for his snoring or apnea. He subsequently underwent CPAP titration to 11cm H2O and now presents for followup.  He is tolerating his CPAP well.  He tolerates the full face mask and feels the pressure is adequate.  He has not had any mouth dryness or nasal congestion.  Since going on the CPAP he feels more rested in the am and has less daytime sleepiness.     Past Medical History  Diagnosis Date  . GERD (gastroesophageal reflux disease)   . Hiatal hernia   . Esophageal stricture   . Colon polyps   . Diverticulosis   . Hemorrhoids   . OSA (obstructive sleep apnea) 10/04/2014    Mild with AHI 7.7/hr  . Obesity (BMI 30-39.9) 06/17/2015    Past Surgical History  Procedure Laterality Date  . Arm fx      right arm , 6 th grade      Current Outpatient Prescriptions  Medication Sig Dispense Refill  . B Complex Vitamins (B COMPLEX PO) as directed    . diltiazem (CARDIZEM CD) 240 MG 24 hr capsule Take 1 capsule (240 mg total) by mouth daily. 90 capsule 3  . MULTIPLE VITAMIN PO Take by mouth.    Marland Kitchen omeprazole (PRILOSEC) 10 MG capsule Take 10 mg by mouth every other day.     No current facility-administered medications for this visit.    Allergies:   Review of patient's allergies indicates no known allergies.    Social  History:  The patient  reports that he has never smoked. He has never used smokeless tobacco. He reports that he drinks alcohol. He reports that he does not use illicit drugs.   Family History:  The patient's family history includes Benign prostatic hyperplasia in his father; Colitis in his mother; Colon cancer in his maternal grandfather; Colon polyps in his brother, father, and sister; Diabetes in his father; Heart disease in his mother. There is no history of Esophageal cancer, Rectal cancer, or Stomach cancer.    ROS:  Please see the history of present illness.   Otherwise, review of systems are positive for none.   All other systems are reviewed and negative.    PHYSICAL EXAM: VS:  BP 90/66 mmHg  Pulse 72  Ht 6\' 2"  (1.88 m)  Wt 99.848 kg (220 lb 2 oz)  BMI 28.25 kg/m2 , BMI Body mass index is 28.25 kg/(m^2). GEN: Well nourished, well developed, in no acute distress HEENT: normal Neck: no JVD, carotid bruits, or masses Cardiac: RRR; no murmurs, rubs, or gallops,no edema  Respiratory:  clear to auscultation bilaterally, normal work of breathing GI: soft, nontender, nondistended, + BS MS:  no deformity or atrophy Skin: warm and dry, no rash Neuro:  Strength and sensation are intact Psych: euthymic mood, full affect   EKG:  EKG was not ordered today.    Recent Labs: No results found for requested labs within last 365 days.    Lipid Panel No results found for: CHOL, TRIG, HDL, CHOLHDL, VLDL, LDLCALC, LDLDIRECT    Wt Readings from Last 3 Encounters:  06/17/15 99.848 kg (220 lb 2 oz)  04/08/15 101.152 kg (223 lb)  01/18/15 101.334 kg (223 lb 6.4 oz)     ASSESSMENT AND PLAN:  1. Mild obstructive sleep apnea with AHI 7.7 events per hour now on CPAP at 11cm H2O.  D/L today showed an AHI of 1/hr on 11cm H2O and 97% compliance in using more than 4 hours nightly.  He will continue on current CPAP settings.  Patient has been using and benefiting from CPAP use and will continue to  benefit from therapy.  2. Excessive daytime sleepiness with snoring - encouraged him to practice good sleep hygiene and try to get 8 hours of sleep nightly. I have also encouraged him to try to avoid sleeping supine since most events occurred in the supine position.  3. Obesity - I have encouraged him to try to get into a routine exercise program   Current medicines are reviewed at length with the patient today.  The patient does not have concerns regarding medicines.  The following changes have been made:  no change  Labs/ tests ordered today: See above Assessment and Plan No orders of the defined types were placed in this encounter.     Disposition:   FU with me in 6 months  Signed, Sueanne Margarita, MD  06/17/2015 10:30 AM    [facility] [place], [place], [place]  [address] Phone: [phone]; Fax: [phone]

## 2015-06-17 NOTE — Patient Instructions (Signed)

## 2015-06-30 ENCOUNTER — Encounter: Payer: Self-pay | Admitting: Cardiology

## 2015-07-02 ENCOUNTER — Other Ambulatory Visit: Payer: Self-pay | Admitting: Cardiology

## 2015-08-05 ENCOUNTER — Encounter: Payer: Self-pay | Admitting: Cardiology

## 2015-08-05 ENCOUNTER — Ambulatory Visit (INDEPENDENT_AMBULATORY_CARE_PROVIDER_SITE_OTHER): Payer: 59 | Admitting: Cardiology

## 2015-08-05 VITALS — BP 124/78 | HR 64 | Ht 74.0 in | Wt 222.8 lb

## 2015-08-05 DIAGNOSIS — E669 Obesity, unspecified: Secondary | ICD-10-CM

## 2015-08-05 DIAGNOSIS — I480 Paroxysmal atrial fibrillation: Secondary | ICD-10-CM | POA: Diagnosis not present

## 2015-08-05 DIAGNOSIS — G4733 Obstructive sleep apnea (adult) (pediatric): Secondary | ICD-10-CM | POA: Diagnosis not present

## 2015-08-05 MED ORDER — DILTIAZEM HCL ER COATED BEADS 120 MG PO CP24: 120.0000 mg | ORAL_CAPSULE | Freq: Every day | ORAL | Status: DC

## 2015-08-05 NOTE — Patient Instructions (Signed)
Medication Instructions:  Please decrease Diltiazem to 120 mg a day. Continue all other medications as listed.  Follow-Up: Follow up in 6 months with Dr. Marlou Porch.  You will receive a letter in the mail 2 months before you are due.  Please call us when you receive this letter to schedule your follow up appointment.  If you need a refill on your cardiac medications before your next appointment, please call your pharmacy.  Thank you for choosing [facility]!!

## 2015-08-05 NOTE — Progress Notes (Signed)
[facility]. [address], [address], [place]  [address] Phone: [phone] Fax:  [phone]  Date:  08/05/2015   ID:  Jerry Barry, DOB [birth year], MRN [id number]  PCP:   Melinda Crutch, MD   History of Present Illness: Jerry Barry is a 55 y.o. male here for follow-up, monitor which demonstrated paroxysmal atrial fibrillation. TSH normal, electrolytes normal. CHADS-Vasc -0. See below for description of original symptoms in October 2015.   Never previously had any cardiovascular issues (I took care of his mother Jerry Barry who unfortunately passed in 2014). He was at the [facility] area show, hot day, sweated and on the drive home began to feel his heart racing, uneasy feeling. He checked his pulse, carotid pulse and it seemed to be fast, slightly irregular at 150 bpm. He pulled over at gas station, drink a full Gatorade and water back-to-back and after approximately 30 minutes this feeling subsided. He then had another episode which woke him up from sleep where his heart felt like it was racing. He felt a uneasy feeling over his left chest, mild pressure-like sensation. Prior to these episodes, he was exercising at the gym on treadmill for 45 minutes able to achieve a heart rate of 130 bpm without any difficulty. He is also felt a sensation of a singular skipping beat, PVC like as well in the evening hours. He has cut out all caffeine, cut out alcohol on the weekend, stopped his vitamin B supplement.  Once the event is gone, he feels fine. He is quite anxious about these episodes. These episodes lasted about 30 minutes. Nonsmoker, no prior cardiac history. No early family history of coronary artery disease.    11/09/14- has felt some episodes of brief arrhythmia. Felt one while watching a movie. Has been tested for sleep apnea and has mild sleep apnea. Is seeing an ENT. We will continue to monitor. When exercising at the gym, heart rate will increase to 118. Overall feels well. No further  episodes of significant chest discomfort. Occasionally stressful situation, traffic for instance may cause some mild fleeting discomfort.   Wt Readings from Last 3 Encounters:  08/05/15 222 lb 12.8 oz (101.061 kg)  06/17/15 220 lb 2 oz (99.848 kg)  04/08/15 223 lb (101.152 kg)     Past Medical History  Diagnosis Date  . GERD (gastroesophageal reflux disease)   . Hiatal hernia   . Esophageal stricture   . Colon polyps   . Diverticulosis   . Hemorrhoids   . OSA (obstructive sleep apnea) 10/04/2014    Mild with AHI 7.7/hr  . Obesity (BMI 30-39.9) 06/17/2015    Past Surgical History  Procedure Laterality Date  . Arm fx      right arm , 6 th grade     Current Outpatient Prescriptions  Medication Sig Dispense Refill  . B Complex Vitamins (B COMPLEX PO) as directed    . diltiazem (CARDIZEM CD) 240 MG 24 hr capsule TAKE ONE CAPSULE BY MOUTH EVERY DAY 90 capsule 3  . MULTIPLE VITAMIN PO Take by mouth.    Marland Kitchen omeprazole (PRILOSEC) 10 MG capsule Take 10 mg by mouth every other day.     No current facility-administered medications for this visit.    Allergies:   No Known Allergies  Social History:  The patient  reports that he has never smoked. He has never used smokeless tobacco. He reports that he drinks alcohol. He reports that he does not use illicit  drugs.   Family History  Problem Relation Age of Onset  . Colon polyps Father   . Diabetes Father   . Benign prostatic hyperplasia Father   . Colon cancer Maternal Grandfather   . Colon polyps Sister   . Colon polyps Brother   . Heart disease Mother     stint  . Colitis Mother   . Esophageal cancer Neg Hx   . Rectal cancer Neg Hx   . Stomach cancer Neg Hx     ROS:  Please see the history of present illness.   Denies any syncope, bleeding, orthopnea, PND, strokelike symptoms   +for palpitations. All other systems reviewed and negative.   PHYSICAL EXAM: VS:  BP 124/78 mmHg  Pulse 64  Ht 6\' 2"  (1.88 m)  Wt 222 lb 12.8 oz  (101.061 kg)  BMI 28.59 kg/m2 Well nourished, well developed, in no acute distress HEENT: normal, War/AT, EOMI Neck: no JVD, normal carotid upstroke, no bruit Cardiac:  normal S1, S2; RRR; no murmur Lungs:  clear to auscultation bilaterally, no wheezing, rhonchi or rales Abd: soft, nontender, no hepatomegaly, no bruits Ext: no edema, 2+ distal pulses Skin: warm and dry GU: deferred Neuro: no focal abnormalities noted, AAO x 3  EKG: Today 08/05/15-sinus rhythm, 65, no other abnormalities personally viewed-prior 05/26/14-sinus rhythm, 68, no other abnormalities.     Echocardiogram: 05/27/14 - Left ventricle: The cavity size was normal. Systolic function was vigorous. The estimated ejection fraction was in the range of 65% to 70%. Wall motion was normal; there were no regional wall motion abnormalities. Left ventricular diastolic function parameters were normal. - Aortic valve: Trileaflet; normal thickness leaflets. There was no regurgitation. - Aortic root: The aortic root was normal in size. - Left atrium: The atrium was mildly dilated. - Right ventricle: Systolic function was normal. - Right atrium: The atrium was normal in size. - Tricuspid valve: There was mild regurgitation. - Pulmonary arteries: Systolic pressure was within the normal range. - Inferior vena cava: The vessel was normal in size. - Pericardium, extracardiac: There was no pericardial effusion.  ASSESSMENT AND PLAN:  1. Paroxysmal atrial fibrillation-he's been doing very well recently. He will feel PVCs most on a daily basis but he has not had any perceivable long-term episodes of atrial fibrillation. We briefly talked about ablative therapy in the future. He did have low blood pressure during a visit with Dr. Radford Pax, 90 systolic. We will cut back his diltiazem to 120 mg once a day. If he is having any difficulty with this, he will let me know. He is not experiencing any side effects. Continue to avoid  caffeine, Sudafed, excessive alcohol. In fact, he thinks that alcohol may be a trigger. He had a glass of wine the other night and felt more palpitations than usual.  Next step would be to add flecainide. We discussed the physiology behind atrial fibrillation. Ablation we discussed. CHADS-Vasc is 0. No anticoagulation needed.  sleep study- OSA. Stress reduction, breathing techniques.  2. Mild chest pressure-Doing better, no further episodes.  Calcium scoring-0. 3. Obstructive sleep apnea-Dr. Radford Pax 4. I will see him back in 6 months. He knows to call me if any worrisome symptoms develop.  Signed, Candee Furbish, MD [facility]  08/05/2015 9:13 AM

## 2015-12-27 ENCOUNTER — Ambulatory Visit (INDEPENDENT_AMBULATORY_CARE_PROVIDER_SITE_OTHER): Payer: 59 | Admitting: Cardiology

## 2015-12-27 ENCOUNTER — Encounter: Payer: Self-pay | Admitting: Cardiology

## 2015-12-27 VITALS — BP 112/74 | HR 61 | Ht 74.0 in | Wt 222.6 lb

## 2015-12-27 DIAGNOSIS — G4719 Other hypersomnia: Secondary | ICD-10-CM | POA: Diagnosis not present

## 2015-12-27 DIAGNOSIS — G4733 Obstructive sleep apnea (adult) (pediatric): Secondary | ICD-10-CM

## 2015-12-27 NOTE — Progress Notes (Signed)
Cardiology Office Note    Date:  12/27/2015   ID:  Jerry Barry, DOB [birth year], MRN [id number]  PCP:   Melinda Crutch, MD  Cardiologist:  Fransico Him, MD   Chief Complaint  Patient presents with  . Sleep Apnea    History of Present Illness:  Jerry Barry is a 55 y.o. male who presents for followup of obstructive sleep apnea. He has mild OSA with an AHI of 7.7 events per hour with mild intermittent snoring and no significant oxygen desaturations.He is on CPAP at 11cm H2O and now presents for followup. He is tolerating his CPAP well. He tolerates the full face mask and feels the pressure is adequate. He has not had any mouth dryness.  He has had some nasal congestion that improved with steroid nasal spray. Since going on the CPAP he feels more rested in the am and has less daytime sleepiness.     Past Medical History  Diagnosis Date  . GERD (gastroesophageal reflux disease)   . Hiatal hernia   . Esophageal stricture   . Colon polyps   . Diverticulosis   . Hemorrhoids   . OSA (obstructive sleep apnea) 10/04/2014    Mild with AHI 7.7/hr  . Obesity (BMI 30-39.9) 06/17/2015    Past Surgical History  Procedure Laterality Date  . Arm fx      right arm , 6 th grade     Current Medications: Outpatient Prescriptions Prior to Visit  Medication Sig Dispense Refill  . diltiazem (CARDIZEM CD) 120 MG 24 hr capsule Take 1 capsule (120 mg total) by mouth daily. 90 capsule 3  . MULTIPLE VITAMIN PO Take by mouth.    Marland Kitchen omeprazole (PRILOSEC) 10 MG capsule Take 10 mg by mouth every other day.     No facility-administered medications prior to visit.     Allergies:   Review of patient's allergies indicates no known allergies.   Social History   Social History  . Marital Status: Single    Spouse Name: N/A  . Number of Children: 0  . Years of Education: N/A   Occupational History  .  [employer]   Social History Main Topics  . Smoking status: Never Smoker   . Smokeless  tobacco: Never Used  . Alcohol Use: Yes     Comment: socially  . Drug Use: No  . Sexual Activity: Not Asked   Other Topics Concern  . None   Social History Narrative     Family History:  The patient's family history includes Benign prostatic hyperplasia in his father; Colitis in his mother; Colon cancer in his maternal grandfather; Colon polyps in his brother, father, and sister; Diabetes in his father; Heart disease in his mother. There is no history of Esophageal cancer, Rectal cancer, or Stomach cancer.   ROS:   Please see the history of present illness.    ROS All other systems reviewed and are negative.   PHYSICAL EXAM:   VS:  BP 112/74 mmHg  Pulse 61  Ht 6\' 2"  (1.88 m)  Wt 222 lb 9.6 oz (100.971 kg)  BMI 28.57 kg/m2   GEN: Well nourished, well developed, in no acute distress HEENT: normal Neck: no JVD, carotid bruits, or masses Cardiac: RRR; no murmurs, rubs, or gallops,no edema.  Intact distal pulses bilaterally.  Respiratory:  clear to auscultation bilaterally, normal work of breathing GI: soft, nontender, nondistended, + BS MS: no deformity or atrophy Skin: warm and dry, no rash Neuro:  Alert and Oriented x 3, Strength and sensation are intact Psych: euthymic mood, full affect  Wt Readings from Last 3 Encounters:  12/27/15 222 lb 9.6 oz (100.971 kg)  08/05/15 222 lb 12.8 oz (101.061 kg)  06/17/15 220 lb 2 oz (99.848 kg)      Studies/Labs Reviewed:   EKG:  EKG is not ordered today.    Recent Labs: No results found for requested labs within last 365 days.   Lipid Panel No results found for: CHOL, TRIG, HDL, CHOLHDL, VLDL, LDLCALC, LDLDIRECT  Additional studies/ records that were reviewed today include:  CPAP D/L    ASSESSMENT:    1. OSA (obstructive sleep apnea)   2. Excessive daytime sleepiness      PLAN:  In order of problems listed above:  OSA - the patient is tolerating PAP therapy well without any problems. The PAP download was reviewed  today and showed an AHI of 0.8/hr on 11 cm H2O with 90% compliance in using more than 4 hours nightly.  The patient has been using and benefiting from CPAP use and will continue to benefit from therapy.  Excessive daytime sleepiness much improved on CPAP      Medication Adjustments/Labs and Tests Ordered: Current medicines are reviewed at length with the patient today.  Concerns regarding medicines are outlined above.  Medication changes, Labs and Tests ordered today are listed in the Patient Instructions below.  There are no Patient Instructions on file for this visit.   Signed, Fransico Him, MD  12/27/2015 2:49 PM    [address], [place], [place]  [address] Phone: [phone]; Fax: [phone]

## 2015-12-27 NOTE — Patient Instructions (Signed)
Medication Instructions:  Your physician recommends that you continue on your current medications as directed. Please refer to the Current Medication list given to you today.   Labwork: None  Testing/Procedures: None  Follow-Up: Your physician wants you to follow-up in: 12 months with Dr. Turner. You will receive a reminder letter in the mail two months in advance. If you don't receive a letter, please call our office to schedule the follow-up appointment.   Any Other Special Instructions Will Be Listed Below (If Applicable).     If you need a refill on your cardiac medications before your next appointment, please call your pharmacy.   

## 2016-07-27 ENCOUNTER — Other Ambulatory Visit: Payer: Self-pay | Admitting: Cardiology

## 2016-09-24 ENCOUNTER — Other Ambulatory Visit: Payer: Self-pay | Admitting: Cardiology

## 2016-09-25 ENCOUNTER — Encounter: Payer: Self-pay | Admitting: Internal Medicine

## 2016-11-13 DIAGNOSIS — D1801 Hemangioma of skin and subcutaneous tissue: Secondary | ICD-10-CM | POA: Diagnosis not present

## 2016-11-13 DIAGNOSIS — L570 Actinic keratosis: Secondary | ICD-10-CM | POA: Diagnosis not present

## 2016-11-13 DIAGNOSIS — Z85828 Personal history of other malignant neoplasm of skin: Secondary | ICD-10-CM | POA: Diagnosis not present

## 2016-11-13 DIAGNOSIS — L821 Other seborrheic keratosis: Secondary | ICD-10-CM | POA: Diagnosis not present

## 2016-11-19 ENCOUNTER — Encounter: Payer: Self-pay | Admitting: Cardiology

## 2016-11-20 ENCOUNTER — Ambulatory Visit (INDEPENDENT_AMBULATORY_CARE_PROVIDER_SITE_OTHER): Payer: 59 | Admitting: Cardiology

## 2016-11-20 ENCOUNTER — Encounter: Payer: Self-pay | Admitting: Cardiology

## 2016-11-20 VITALS — BP 115/70 | HR 57 | Ht 74.0 in | Wt 230.6 lb

## 2016-11-20 DIAGNOSIS — I480 Paroxysmal atrial fibrillation: Secondary | ICD-10-CM

## 2016-11-20 DIAGNOSIS — G4733 Obstructive sleep apnea (adult) (pediatric): Secondary | ICD-10-CM | POA: Diagnosis not present

## 2016-11-20 MED ORDER — DILTIAZEM HCL ER COATED BEADS 120 MG PO CP24: 120.0000 mg | ORAL_CAPSULE | ORAL | 0 refills | Status: DC

## 2016-11-20 NOTE — Patient Instructions (Signed)
Medication Instructions:  Please take Diltiazem every other day. Continue all other medications as listed.  Follow-Up: Follow up in 1 year with Dr. Marlou Porch.  You will receive a letter in the mail 2 months before you are due.  Please call us when you receive this letter to schedule your follow up appointment.  If you need a refill on your cardiac medications before your next appointment, please call your pharmacy.  Thank you for choosing [facility]!!

## 2016-11-20 NOTE — Progress Notes (Signed)
[name], [address], [place]  [address] Phone: [phone] Fax:  [phone]  Date:  11/20/2016   ID:  Jerry Barry, DOB June 10, 1961, MRN [id number]  PCP:  Melinda Crutch, MD   History of Present Illness: Jerry Barry is a 56 y.o. male here for follow-up, monitor which demonstrated paroxysmal atrial fibrillation. TSH normal, electrolytes normal. CHADS-Vasc -0. See below for description of original symptoms in October 2015.   Never previously had any cardiovascular issues (I took care of his mother Jerry Barry who unfortunately passed in 2014). He was at the [facility] area show, hot day, sweated and on the drive home began to feel his heart racing, uneasy feeling. He checked his pulse, carotid pulse and it seemed to be fast, slightly irregular at 150 bpm. He pulled over at gas station, drink a full Gatorade and water back-to-back and after approximately 30 minutes this feeling subsided. He then had another episode which woke him up from sleep where his heart felt like it was racing. He felt a uneasy feeling over his left chest, mild pressure-like sensation. Prior to these episodes, he was exercising at the gym on treadmill for 45 minutes able to achieve a heart rate of 130 bpm without any difficulty. He is also felt a sensation of a singular skipping beat, PVC like as well in the evening hours. He has cut out all caffeine, cut out alcohol on the weekend, stopped his vitamin B supplement.  Once the event is gone, he feels fine. He is quite anxious about these episodes. These episodes lasted about 30 minutes. Nonsmoker, no prior cardiac history. No early family history of coronary artery disease.    11/09/14- has felt some episodes of brief arrhythmia. Felt one while watching a movie. Has been tested for sleep apnea and has mild sleep apnea. Is seeing an ENT. We will continue to monitor. When exercising at the gym, heart rate will increase to 118. Overall feels well. No further  episodes of significant chest discomfort. Occasionally stressful situation, traffic for instance may cause some mild fleeting discomfort.  11/20/16-he has been treated for sleep apnea with CPAP mask by Dr. Radford Pax. Overall doing well. No further palpitations. CPAP helped. Liquid minerals now. He feels great.   Wt Readings from Last 3 Encounters:  11/20/16 230 lb 9.6 oz (104.6 kg)  12/27/15 222 lb 9.6 oz (101 kg)  08/05/15 222 lb 12.8 oz (101.1 kg)     Past Medical History:  Diagnosis Date  . Colon polyps   . Diverticulosis   . Esophageal stricture   . GERD (gastroesophageal reflux disease)   . Hemorrhoids   . Hiatal hernia   . Obesity (BMI 30-39.9) 06/17/2015  . OSA (obstructive sleep apnea) 10/04/2014   Mild with AHI 7.7/hr    Past Surgical History:  Procedure Laterality Date  . arm fx     right arm , 6 th grade     Current Outpatient Prescriptions  Medication Sig Dispense Refill  . diltiazem (CARDIZEM CD) 120 MG 24 hr capsule TAKE ONE CAPSULE BY MOUTH EVERY DAY 90 capsule 0  . MULTIPLE VITAMIN PO Take by mouth.    Marland Kitchen omeprazole (PRILOSEC) 10 MG capsule Take 10 mg by mouth every other day.     No current facility-administered medications for this visit.     Allergies:   No Known Allergies  Social History:  The patient  reports that he has never smoked. He has never used  smokeless tobacco. He reports that he drinks alcohol. He reports that he does not use drugs.   Family History  Problem Relation Age of Onset  . Colon polyps Father   . Diabetes Father   . Benign prostatic hyperplasia Father   . Colon polyps Sister   . Colon polyps Brother   . Heart disease Mother     stint  . Colitis Mother   . Colon cancer Maternal Grandfather   . Esophageal cancer Neg Hx   . Rectal cancer Neg Hx   . Stomach cancer Neg Hx     ROS:  Please see the history of present illness.   Denies any syncope, bleeding, orthopnea, PND, strokelike symptoms   Unless specified above all other  review of systems negative.  PHYSICAL EXAM: VS:  BP 115/70   Pulse (!) 57   Ht 6\' 2"  (1.88 m)   Wt 230 lb 9.6 oz (104.6 kg)   BMI 29.61 kg/m  GEN: Well nourished, well developed, in no acute distress  HEENT: normal  Neck: no JVD, carotid bruits, or masses Cardiac: RRR; no murmurs, rubs, or gallops,no edema  Respiratory:  clear to auscultation bilaterally, normal work of breathing GI: soft, nontender, nondistended, + BS MS: no deformity or atrophy  Skin: warm and dry, no rash Neuro:  Alert and Oriented x 3, Strength and sensation are intact Psych: euthymic mood, full affect   EKG: Today 11/20/16 shows sinus bradycardia rate 57 with no other significant abnormalities personally viewed-prior 08/05/15-sinus rhythm, 65, no other abnormalities personally viewed-prior 05/26/14-sinus rhythm, 68, no other abnormalities.     Echocardiogram: 05/27/14 - Left ventricle: The cavity size was normal. Systolic function was vigorous. The estimated ejection fraction was in the range of 65% to 70%. Wall motion was normal; there were no regional wall motion abnormalities. Left ventricular diastolic function parameters were normal. - Aortic valve: Trileaflet; normal thickness leaflets. There was no regurgitation. - Aortic root: The aortic root was normal in size. - Left atrium: The atrium was mildly dilated. - Right ventricle: Systolic function was normal. - Right atrium: The atrium was normal in size. - Tricuspid valve: There was mild regurgitation. - Pulmonary arteries: Systolic pressure was within the normal range. - Inferior vena cava: The vessel was normal in size. - Pericardium, extracardiac: There was no pericardial effusion.  ASSESSMENT AND PLAN:   Paroxysmal atrial fibrillation -Very rare episode previously. On event monitor this was caught at night. Since he has not had any perceived episodes in the past 6 months I will be fine with pulling back on the diltiazem. He would  like to take it every other day and this is fine with me. Eventually if he would like to try without ill diltiazem, stopping it this would be fine as well. -I reminded him that atrial fibrillation may return with or without the medication. -Perhaps his use of CPAP and liquid minerals have helped. -He thought prior trigger may have been alcohol however he has had a couple drinks here and there and has not triggered his atrial fibrillation.  Obstructive sleep apnea  - Well treated, Dr. Radford Pax.  Prior calcium score-0   1.  2. Mild chest pressure-Doing better, no further episodes.  Calcium scoring-0. 3. Obstructive sleep apnea-Dr. Radford Pax 4. I will see him back in 6 months. He knows to call me if any worrisome symptoms develop.  Signed, Candee Furbish, MD [facility]  11/20/2016 2:16 PM

## 2016-11-30 ENCOUNTER — Encounter: Payer: Self-pay | Admitting: Internal Medicine

## 2016-12-05 ENCOUNTER — Encounter: Payer: Self-pay | Admitting: Cardiology

## 2016-12-19 ENCOUNTER — Encounter: Payer: Self-pay | Admitting: Cardiology

## 2016-12-26 ENCOUNTER — Ambulatory Visit (INDEPENDENT_AMBULATORY_CARE_PROVIDER_SITE_OTHER): Payer: 59 | Admitting: Cardiology

## 2016-12-26 ENCOUNTER — Encounter: Payer: Self-pay | Admitting: Cardiology

## 2016-12-26 ENCOUNTER — Encounter (INDEPENDENT_AMBULATORY_CARE_PROVIDER_SITE_OTHER): Payer: Self-pay

## 2016-12-26 VITALS — BP 132/86 | HR 66 | Ht 74.0 in | Wt 228.8 lb

## 2016-12-26 DIAGNOSIS — G4733 Obstructive sleep apnea (adult) (pediatric): Secondary | ICD-10-CM | POA: Diagnosis not present

## 2016-12-26 NOTE — Patient Instructions (Signed)

## 2016-12-26 NOTE — Progress Notes (Signed)
Cardiology Office Note    Date:  12/26/2016   ID:  Conlan, Miceli [birth year], MRN [id number]  PCP:  Lawerance Cruel, MD  Cardiologist:  Candee Furbish, MD   Chief Complaint  Patient presents with  . Sleep Apnea    History of Present Illness:  Jerry Barry is a 56 y.o. male who presents for followup of obstructive sleep apnea. He has mild OSA with an AHI of 7.7 events per hour on PSG with mild intermittent snoring and no significant oxygen desaturations.He is on CPAP at 11cm H2O.  He is  now here for followup. He is doing well with  his CPAP. He tolerates the full face mask and feels the pressure is adequate enough to sleep well at night. He denies any significant mouth or nasal dryness.  His nasal congestion has resolved after adjusting the humidity.   He continues to feel rested in the am and no significant daytime sleepiness.  He does not thinks he snores when using his CPAP.  He says that his PVCs have resolved after going on CPAP.    Past Medical History:  Diagnosis Date  . Colon polyps   . Diverticulosis   . Esophageal stricture   . GERD (gastroesophageal reflux disease)   . Hemorrhoids   . Hiatal hernia   . Obesity (BMI 30-39.9) 06/17/2015  . OSA (obstructive sleep apnea) 10/04/2014   Mild with AHI 7.7/hr    Past Surgical History:  Procedure Laterality Date  . arm fx     right arm , 6 th grade     Current Medications: No outpatient prescriptions have been marked as taking for the 12/26/16 encounter (Office Visit) with Sueanne Margarita, MD.    Allergies:   Patient has no known allergies.   Social History   Social History  . Marital status: Single    Spouse name: N/A  . Number of children: 0  . Years of education: N/A   Occupational History  .  [employer]   Social History Main Topics  . Smoking status: Never Smoker  . Smokeless tobacco: Never Used  . Alcohol use Yes     Comment: socially  . Drug use: No  . Sexual activity: Not Asked   Other  Topics Concern  . None   Social History Narrative  . None     Family History:  The patient's family history includes Benign prostatic hyperplasia in his father; Colitis in his mother; Colon cancer in his maternal grandfather; Colon polyps in his brother, father, and sister; Diabetes in his father; Heart disease in his mother.   ROS:   Please see the history of present illness.    ROS All other systems reviewed and are negative.  No flowsheet data found.     PHYSICAL EXAM:   VS:  BP 132/86   Pulse 66   Ht 6\' 2"  (1.88 m)   Wt 228 lb 12.8 oz (103.8 kg)   SpO2 99%   BMI 29.38 kg/m    GEN: Well nourished, well developed, in no acute distress  HEENT: normal  Neck: no JVD, carotid bruits, or masses Cardiac: RRR; no murmurs, rubs, or gallops,no edema.  Intact distal pulses bilaterally.  Respiratory:  clear to auscultation bilaterally, normal work of breathing GI: soft, nontender, nondistended, + BS MS: no deformity or atrophy  Skin: warm and dry, no rash Neuro:  Alert and Oriented x 3, Strength and sensation are intact Psych: euthymic mood, full affect  Wt Readings from Last 3 Encounters:  12/26/16 228 lb 12.8 oz (103.8 kg)  11/20/16 230 lb 9.6 oz (104.6 kg)  12/27/15 222 lb 9.6 oz (101 kg)      Studies/Labs Reviewed:   EKG:  EKG is not ordered today.   Recent Labs: No results found for requested labs within last 8760 hours.   Lipid Panel No results found for: CHOL, TRIG, HDL, CHOLHDL, VLDL, LDLCALC, LDLDIRECT  Additional studies/ records that were reviewed today include:  CPAP download    ASSESSMENT:    1. OSA (obstructive sleep apnea)      PLAN:  In order of problems listed above:  OSA - the patient is tolerating PAP therapy well without any problems. The PAP download was reviewed today and showed an AHI of 1/hr on 11 cm H2O with 97% compliance in using more than 4 hours nightly.  The patient has been using and benefiting from CPAP use and will continue  to benefit from therapy. I will order him new CPAP supplies today.       Medication Adjustments/Labs and Tests Ordered: Current medicines are reviewed at length with the patient today.  Concerns regarding medicines are outlined above.  Medication changes, Labs and Tests ordered today are listed in the Patient Instructions below.  There are no Patient Instructions on file for this visit.   Signed, Fransico Him, MD  12/26/2016 2:19 PM    [facility] [address], [place], [place]  [address] Phone: [phone]; Fax: [phone]

## 2016-12-28 DIAGNOSIS — G4733 Obstructive sleep apnea (adult) (pediatric): Secondary | ICD-10-CM | POA: Diagnosis not present

## 2016-12-31 DIAGNOSIS — Z23 Encounter for immunization: Secondary | ICD-10-CM | POA: Diagnosis not present

## 2017-01-14 DIAGNOSIS — Z0000 Encounter for general adult medical examination without abnormal findings: Secondary | ICD-10-CM | POA: Diagnosis not present

## 2017-01-14 DIAGNOSIS — Z1159 Encounter for screening for other viral diseases: Secondary | ICD-10-CM | POA: Diagnosis not present

## 2017-01-14 DIAGNOSIS — Z23 Encounter for immunization: Secondary | ICD-10-CM | POA: Diagnosis not present

## 2017-01-23 ENCOUNTER — Ambulatory Visit (AMBULATORY_SURGERY_CENTER): Payer: Self-pay

## 2017-01-23 VITALS — Ht 74.0 in | Wt 227.0 lb

## 2017-01-23 DIAGNOSIS — Z86010 Personal history of colonic polyps: Secondary | ICD-10-CM

## 2017-01-23 MED ORDER — NA SULFATE-K SULFATE-MG SULF 17.5-3.13-1.6 GM/177ML PO SOLN: 1.0000 | Freq: Once | ORAL | 0 refills | Status: AC

## 2017-01-23 NOTE — Progress Notes (Signed)
Denies allergies to eggs or soy products. Denies complication of anesthesia or sedation. Denies use of weight loss medication. Denies use of O2.   Emmi instructions declined. Patient has had multiple colonoscopies.

## 2017-02-01 DIAGNOSIS — Z8042 Family history of malignant neoplasm of prostate: Secondary | ICD-10-CM | POA: Diagnosis not present

## 2017-02-01 DIAGNOSIS — N411 Chronic prostatitis: Secondary | ICD-10-CM | POA: Diagnosis not present

## 2017-02-04 ENCOUNTER — Encounter: Payer: Self-pay | Admitting: Internal Medicine

## 2017-02-05 ENCOUNTER — Telehealth: Payer: Self-pay | Admitting: Internal Medicine

## 2017-02-05 ENCOUNTER — Other Ambulatory Visit: Payer: Self-pay | Admitting: Cardiology

## 2017-02-05 NOTE — Telephone Encounter (Signed)
Keep colonoscopy as planned. Tell him to discuss whether or not he needs an EGD at the time of his colonoscopy. If so, we will reschedule it for another day. This would save him an office visit anyway.

## 2017-02-05 NOTE — Telephone Encounter (Signed)
Pt is currently scheduled for Colonoscopy. Pt wants to know if Dr. Henrene Pastor feels he should have an EGD done also. States he has a hiatal hernia and has some issues with reflux and spasms. Also states he had a stricture that has been dilated in the past. He knows if he has the EGD added he would have to reschedule to another date to accommodate the EGD. Please advise.

## 2017-02-05 NOTE — Telephone Encounter (Signed)
So you want him to do the colon prep and then discuss if he needs EGD and possibly have colon rescheduled to a date where both procedures can be done? Please advise.

## 2017-02-05 NOTE — Telephone Encounter (Signed)
Know, looks do a colonoscopy as planned. He can discuss with me that day his upper GI complaints. If I feel he needs an EGD this will be scheduled no other day by itself

## 2017-02-06 ENCOUNTER — Other Ambulatory Visit: Payer: Self-pay | Admitting: *Deleted

## 2017-02-06 NOTE — Telephone Encounter (Signed)
Spoke with pt and he is aware. 

## 2017-02-13 ENCOUNTER — Encounter: Payer: Self-pay | Admitting: Internal Medicine

## 2017-02-13 ENCOUNTER — Ambulatory Visit (AMBULATORY_SURGERY_CENTER): Payer: 59 | Admitting: Internal Medicine

## 2017-02-13 VITALS — BP 106/70 | HR 63 | Temp 98.2°F | Resp 7 | Ht 74.0 in | Wt 227.0 lb

## 2017-02-13 DIAGNOSIS — Z8371 Family history of colonic polyps: Secondary | ICD-10-CM | POA: Diagnosis not present

## 2017-02-13 DIAGNOSIS — Z800 Family history of malignant neoplasm of digestive organs: Secondary | ICD-10-CM

## 2017-02-13 DIAGNOSIS — Z86010 Personal history of colonic polyps: Secondary | ICD-10-CM | POA: Diagnosis present

## 2017-02-13 MED ORDER — SODIUM CHLORIDE 0.9 % IV SOLN: 500.0000 mL | INTRAVENOUS | Status: AC

## 2017-02-13 NOTE — Progress Notes (Signed)
Pt's states no medical or surgical changes since previsit or office visit.Pt's states no medical or surgical changes since previsit or office visit. 

## 2017-02-13 NOTE — Progress Notes (Signed)
No problems noted in the recovery room. maw 

## 2017-02-13 NOTE — Progress Notes (Signed)
Spontaneous respirations throughout. VSS. Resting comfortably. To PACU on room air. Report to  Annette RN.  

## 2017-02-13 NOTE — Patient Instructions (Signed)
YOU HAD AN ENDOSCOPIC PROCEDURE TODAY AT THE [facility]:   Refer to the procedure report that was given to you for any specific questions about what was found during the examination.  If the procedure report does not answer your questions, please call your gastroenterologist to clarify.  If you requested that your care partner not be given the details of your procedure findings, then the procedure report has been included in a sealed envelope for you to review at your convenience later.  YOU SHOULD EXPECT: Some feelings of bloating in the abdomen. Passage of more gas than usual.  Walking can help get rid of the air that was put into your GI tract during the procedure and reduce the bloating. If you had a lower endoscopy (such as a colonoscopy or flexible sigmoidoscopy) you may notice spotting of blood in your stool or on the toilet paper. If you underwent a bowel prep for your procedure, you may not have a normal bowel movement for a few days.  Please Note:  You might notice some irritation and congestion in your nose or some drainage.  This is from the oxygen used during your procedure.  There is no need for concern and it should clear up in a day or so.  SYMPTOMS TO REPORT IMMEDIATELY:   Following lower endoscopy (colonoscopy or flexible sigmoidoscopy):  Excessive amounts of blood in the stool  Significant tenderness or worsening of abdominal pains  Swelling of the abdomen that is new, acute  Fever of 100F or higher    For urgent or emergent issues, a gastroenterologist can be reached at any hour by calling [phone].   DIET:  We do recommend a small meal at first, but then you may proceed to your regular diet.  Drink plenty of fluids but you should avoid alcoholic beverages for 24 hours.  ACTIVITY:  You should plan to take it easy for the rest of today and you should NOT DRIVE or use heavy machinery until tomorrow (because of the sedation medicines used during the test).     FOLLOW UP: Our staff will call the number listed on your records the next business day following your procedure to check on you and address any questions or concerns that you may have regarding the information given to you following your procedure. If we do not reach you, we will leave a message.  However, if you are feeling well and you are not experiencing any problems, there is no need to return our call.  We will assume that you have returned to your regular daily activities without incident.  If any biopsies were taken you will be contacted by phone or by letter within the next 1-3 weeks.  Please call us at [phone] if you have not heard about the biopsies in 3 weeks.    SIGNATURES/CONFIDENTIALITY: You and/or your care partner have signed paperwork which will be entered into your electronic medical record.  These signatures attest to the fact that that the information above on your After Visit Summary has been reviewed and is understood.  Full responsibility of the confidentiality of this discharge information lies with you and/or your care-partner.     You may resume your current medications today. Repeat colonoscopy in 5 years for surveillance. Please call if any questions or concerns.   

## 2017-02-13 NOTE — Op Note (Signed)
[place] Patient Name: Jerry Barry Procedure Date: 02/13/2017 8:57 AM MRN: [id number] Endoscopist: Docia Chuck. Henrene Pastor , MD Age: 56 Referring MD:  Date of Birth: 05-26-1961 Gender: Male Account #: [id number] Procedure:                Colonoscopy Indications:              High risk colon cancer surveillance: Personal                            history of colonic polyps. Prior examination 2007                            (no pathology), 2013 (negative). Family history of                            advanced adenoma and father less than 65; sister                            with multiple adenomas; grandparent with colorectal                            cancer Medicines:                Monitored Anesthesia Care Procedure:                Pre-Anesthesia Assessment:                           - Prior to the procedure, a History and Physical                            was performed, and patient medications and                            allergies were reviewed. The patient's tolerance of                            previous anesthesia was also reviewed. The risks                            and benefits of the procedure and the sedation                            options and risks were discussed with the patient.                            All questions were answered, and informed consent                            was obtained. Prior Anticoagulants: The patient has                            taken no previous anticoagulant or antiplatelet  agents. ASA Grade Assessment: II - A patient with                            mild systemic disease. After reviewing the risks                            and benefits, the patient was deemed in                            satisfactory condition to undergo the procedure.                           After obtaining informed consent, the colonoscope                            was passed under direct vision. Throughout the                procedure, the patient's blood pressure, pulse, and                            oxygen saturations were monitored continuously. The                            Colonoscope was introduced through the anus and                            advanced to the the cecum, identified by                            appendiceal orifice and ileocecal valve. The                            ileocecal valve, appendiceal orifice, and rectum                            were photographed. The quality of the bowel                            preparation was excellent. The colonoscopy was                            performed without difficulty. The patient tolerated                            the procedure well. The bowel preparation used was                            SUPREP. Scope In: 9:22:11 AM Scope Out: 9:35:22 AM Scope Withdrawal Time: 0 hours 10 minutes 57 seconds  Total Procedure Duration: 0 hours 13 minutes 11 seconds  Findings:                 The entire examined colon appeared normal on direct  and retroflexion views. Complications:            No immediate complications. Estimated blood loss:                            None. Estimated Blood Loss:     Estimated blood loss: none. Impression:               - The entire examined colon is normal on direct and                            retroflexion views.                           - No specimens collected. Recommendation:           - Repeat colonoscopy in 5 years for surveillance.                           - Patient has a contact number available for                            emergencies. The signs and symptoms of potential                            delayed complications were discussed with the                            patient. Return to normal activities tomorrow.                            Written discharge instructions were provided to the                            patient.                           - Resume  previous diet.                           - Continue present medications. Docia Chuck. Henrene Pastor, MD 02/13/2017 9:40:20 AM This report has been signed electronically.

## 2017-02-14 ENCOUNTER — Telehealth: Payer: Self-pay

## 2017-02-14 ENCOUNTER — Telehealth: Payer: Self-pay | Admitting: *Deleted

## 2017-02-14 NOTE — Telephone Encounter (Signed)
  Follow up Call-  Call back number 02/13/2017  Post procedure Call Back phone  # [phone]  Permission to leave phone message Yes  Some recent data might be hidden     Patient questions:  Do you have a fever, pain , or abdominal swelling? No. Pain Score  0 *  Have you tolerated food without any problems? Yes.    Have you been able to return to your normal activities? Yes.    Do you have any questions about your discharge instructions: Diet   No. Medications  No. Follow up visit  No.  Do you have questions or concerns about your Care? No.  Actions: * If pain score is 4 or above: No action needed, pain <4.

## 2017-02-14 NOTE — Telephone Encounter (Signed)
Called [phone] and the call did not go through.  Will call again this afternoon for follow up call. maw

## 2017-06-17 DIAGNOSIS — D3132 Benign neoplasm of left choroid: Secondary | ICD-10-CM | POA: Diagnosis not present

## 2017-07-01 DIAGNOSIS — H35362 Drusen (degenerative) of macula, left eye: Secondary | ICD-10-CM | POA: Diagnosis not present

## 2017-07-01 DIAGNOSIS — H43391 Other vitreous opacities, right eye: Secondary | ICD-10-CM | POA: Diagnosis not present

## 2017-07-01 DIAGNOSIS — H33001 Unspecified retinal detachment with retinal break, right eye: Secondary | ICD-10-CM | POA: Diagnosis not present

## 2017-07-01 DIAGNOSIS — H43813 Vitreous degeneration, bilateral: Secondary | ICD-10-CM | POA: Diagnosis not present

## 2017-07-17 DIAGNOSIS — Z23 Encounter for immunization: Secondary | ICD-10-CM | POA: Diagnosis not present

## 2017-07-18 DIAGNOSIS — H43813 Vitreous degeneration, bilateral: Secondary | ICD-10-CM | POA: Diagnosis not present

## 2017-07-18 DIAGNOSIS — H43391 Other vitreous opacities, right eye: Secondary | ICD-10-CM | POA: Diagnosis not present

## 2017-07-18 DIAGNOSIS — H35362 Drusen (degenerative) of macula, left eye: Secondary | ICD-10-CM | POA: Diagnosis not present

## 2017-10-22 ENCOUNTER — Other Ambulatory Visit: Payer: Self-pay | Admitting: Cardiology

## 2017-10-22 MED ORDER — DILTIAZEM HCL ER COATED BEADS 120 MG PO CP24: 120.0000 mg | ORAL_CAPSULE | ORAL | 0 refills | Status: DC

## 2017-10-26 ENCOUNTER — Other Ambulatory Visit: Payer: Self-pay | Admitting: Cardiology

## 2017-10-30 ENCOUNTER — Other Ambulatory Visit: Payer: Self-pay | Admitting: *Deleted

## 2017-10-30 MED ORDER — DILTIAZEM HCL ER COATED BEADS 120 MG PO CP24: 120.0000 mg | ORAL_CAPSULE | ORAL | 0 refills | Status: DC

## 2017-12-04 ENCOUNTER — Encounter: Payer: Self-pay | Admitting: *Deleted

## 2017-12-05 ENCOUNTER — Ambulatory Visit (INDEPENDENT_AMBULATORY_CARE_PROVIDER_SITE_OTHER): Payer: 59 | Admitting: Cardiology

## 2017-12-05 ENCOUNTER — Encounter: Payer: Self-pay | Admitting: Cardiology

## 2017-12-05 VITALS — BP 122/80 | HR 60 | Ht 74.0 in | Wt 222.6 lb

## 2017-12-05 DIAGNOSIS — G4733 Obstructive sleep apnea (adult) (pediatric): Secondary | ICD-10-CM

## 2017-12-05 DIAGNOSIS — I480 Paroxysmal atrial fibrillation: Secondary | ICD-10-CM | POA: Diagnosis not present

## 2017-12-05 MED ORDER — DILTIAZEM HCL ER COATED BEADS 120 MG PO CP24: 120.0000 mg | ORAL_CAPSULE | ORAL | 3 refills | Status: DC

## 2017-12-05 NOTE — Patient Instructions (Signed)

## 2017-12-05 NOTE — Progress Notes (Signed)
[place]. [address], [address] [place], [place]  [address] Phone: [phone] Fax:  [phone]  Date:  12/05/2017   ID:  Jerry Barry, DOB Dec 01, 1960, MRN [id number]  PCP:  Lawerance Cruel, MD   History of Present Illness: Jerry Barry is a 57 y.o. male here for follow-up, monitor which demonstrated paroxysmal atrial fibrillation. TSH normal, electrolytes normal. CHADS-Vasc -0. See below for description of original symptoms in October 2015.   Never previously had any cardiovascular issues (I took care of his mother Jerry Barry who unfortunately passed in 2014). He was at the [facility] area show, hot day, sweated and on the drive home began to feel his heart racing, uneasy feeling. He checked his pulse, carotid pulse and it seemed to be fast, slightly irregular at 150 bpm. He pulled over at gas station, drink a full Gatorade and water back-to-back and after approximately 30 minutes this feeling subsided. He then had another episode which woke him up from sleep where his heart felt like it was racing. He felt a uneasy feeling over his left chest, mild pressure-like sensation. Prior to these episodes, he was exercising at the gym on treadmill for 45 minutes able to achieve a heart rate of 130 bpm without any difficulty. He is also felt a sensation of a singular skipping beat, PVC like as well in the evening hours. He has cut out all caffeine, cut out alcohol on the weekend, stopped his vitamin B supplement.  Once the event is gone, he feels fine. He is quite anxious about these episodes. These episodes lasted about 30 minutes. Nonsmoker, no prior cardiac history. No early family history of coronary artery disease.    11/09/14- has felt some episodes of brief arrhythmia. Felt one while watching a movie. Has been tested for sleep apnea and has mild sleep apnea. Is seeing an ENT. We will continue to monitor. When exercising at the gym, heart rate will increase to 118. Overall feels well. No further  episodes of significant chest discomfort. Occasionally stressful situation, traffic for instance may cause some mild fleeting discomfort.  11/20/16-he has been treated for sleep apnea with CPAP mask by Dr. Radford Pax. Overall doing well. No further palpitations. CPAP helped. Liquid minerals now. He feels great.  12/05/2017 - very rare maybe once a week AFIB 3-4 min. Tried to stop dilt but came back. Now QOD.  Doing very well.  Very rare episodes of palpitations.  No chest pain fevers chills nausea vomiting syncope.  Last LDL 130, 0.9 creatinine   Wt Readings from Last 3 Encounters:  12/05/17 222 lb 9.6 oz (101 kg)  02/13/17 227 lb (103 kg)  01/23/17 227 lb (103 kg)     Past Medical History:  Diagnosis Date  . Colon polyps   . Diverticulosis   . Esophageal stricture   . GERD (gastroesophageal reflux disease)   . Hemorrhoids   . Hiatal hernia   . Obesity (BMI 30-39.9) 06/17/2015  . OSA (obstructive sleep apnea) 10/04/2014   Mild with AHI 7.7/hr  . PVC (premature ventricular contraction)   . Sleep apnea     Past Surgical History:  Procedure Laterality Date  . arm fx     right arm , 6 th grade   . fractured hand Right     Current Outpatient Medications  Medication Sig Dispense Refill  . diltiazem (DILACOR XR) 120 MG 24 hr capsule Take 120 mg by mouth every other day.    Marland Kitchen  MULTIPLE VITAMIN PO Take by mouth.    Marland Kitchen omeprazole (PRILOSEC) 10 MG capsule Take 10 mg by mouth every other day.    Marland Kitchen OVER THE COUNTER MEDICATION Saw Palmetto, 1 gel cap, 160 mg. daily    . diltiazem (CARDIZEM CD) 120 MG 24 hr capsule Take 1 capsule (120 mg total) by mouth every other day. 45 capsule 3   Current Facility-Administered Medications  Medication Dose Route Frequency Provider Last Rate Last Dose  . 0.9 %  sodium chloride infusion  500 mL Intravenous Continuous Irene Shipper, MD        Allergies:   No Known Allergies  Social History:  The patient  reports that he has never smoked. He has never used  smokeless tobacco. He reports that he drinks alcohol. He reports that he does not use drugs.   Family History  Problem Relation Age of Onset  . Colon polyps Father   . Diabetes Father   . Benign prostatic hyperplasia Father   . Colon polyps Sister   . Colon polyps Brother   . Heart disease Mother        stent  . Colitis Mother   . Colon cancer Maternal Grandfather   . Esophageal cancer Neg Hx   . Rectal cancer Neg Hx   . Stomach cancer Neg Hx     ROS:  Please see the history of present illness.  All others negative  PHYSICAL EXAM: VS:  BP 122/80   Pulse 60   Ht 6\' 2"  (1.88 m)   Wt 222 lb 9.6 oz (101 kg)   BMI 28.58 kg/m  GEN: Well nourished, well developed, in no acute distress  HEENT: normal  Neck: no JVD, carotid bruits, or masses Cardiac: RRR; no murmurs, rubs, or gallops,no edema  Respiratory:  clear to auscultation bilaterally, normal work of breathing GI: soft, nontender, nondistended, + BS MS: no deformity or atrophy  Skin: warm and dry, no rash Neuro:  Alert and Oriented x 3, Strength and sensation are intact Psych: euthymic mood, full affect   EKG: Today 11/20/16 shows sinus bradycardia rate 57 with no other significant abnormalities personally viewed-prior 08/05/15-sinus rhythm, 65, no other abnormalities personally viewed-prior 05/26/14-sinus rhythm, 68, no other abnormalities.     Echocardiogram: 05/27/14 - Left ventricle: The cavity size was normal. Systolic function was vigorous. The estimated ejection fraction was in the range of 65% to 70%. Wall motion was normal; there were no regional wall motion abnormalities. Left ventricular diastolic function parameters were normal. - Aortic valve: Trileaflet; normal thickness leaflets. There was no regurgitation. - Aortic root: The aortic root was normal in size. - Left atrium: The atrium was mildly dilated. - Right ventricle: Systolic function was normal. - Right atrium: The atrium was normal in  size. - Tricuspid valve: There was mild regurgitation. - Pulmonary arteries: Systolic pressure was within the normal range. - Inferior vena cava: The vessel was normal in size. - Pericardium, extracardiac: There was no pericardial effusion.  ASSESSMENT AND PLAN:   Paroxysmal atrial fibrillation -Very rare episode previously. On event monitor this was caught at night. Since he has not had any perceived episodes recently I am fine with lower dose diltiazem QOD. Eventually if he would like to try without ill diltiazem, stopping it this would be fine as well. -I reminded him that atrial fibrillation may return with or without the medication. -Perhaps his use of CPAP and liquid minerals have helped. -He thought prior trigger may have been alcohol  however he has had a couple drinks here and there and has not triggered his atrial fibrillation.  Interestingly, he feels like a finger is pushing on his throat before he will feel a PVC or palpitations.  Obstructive sleep apnea  - Well treated, Dr. Radford Pax.  Doing very well.  His prior episode of atrial fibrillation was caught during a sleep study at night.  [id number]  Mild chest pressure-Doing better, no further episodes.  Calcium scoring-0.  Obstructive sleep apnea-Dr. Radford Pax  I will see him back in 12 months. He knows to call me if any worrisome symptoms develop.  Signed, Candee Furbish, MD [facility]  12/05/2017 3:15 PM

## 2018-02-03 DIAGNOSIS — N401 Enlarged prostate with lower urinary tract symptoms: Secondary | ICD-10-CM | POA: Diagnosis not present

## 2018-02-03 DIAGNOSIS — N138 Other obstructive and reflux uropathy: Secondary | ICD-10-CM | POA: Diagnosis not present

## 2018-02-03 DIAGNOSIS — Z8042 Family history of malignant neoplasm of prostate: Secondary | ICD-10-CM | POA: Diagnosis not present

## 2018-06-04 DIAGNOSIS — G4733 Obstructive sleep apnea (adult) (pediatric): Secondary | ICD-10-CM | POA: Diagnosis not present

## 2018-06-05 DIAGNOSIS — J0100 Acute maxillary sinusitis, unspecified: Secondary | ICD-10-CM | POA: Diagnosis not present

## 2018-06-05 DIAGNOSIS — R0981 Nasal congestion: Secondary | ICD-10-CM | POA: Diagnosis not present

## 2018-07-03 DIAGNOSIS — H6122 Impacted cerumen, left ear: Secondary | ICD-10-CM | POA: Diagnosis not present

## 2018-07-03 DIAGNOSIS — H9319 Tinnitus, unspecified ear: Secondary | ICD-10-CM | POA: Diagnosis not present

## 2018-07-15 DIAGNOSIS — D3132 Benign neoplasm of left choroid: Secondary | ICD-10-CM | POA: Diagnosis not present

## 2018-10-08 DIAGNOSIS — M7542 Impingement syndrome of left shoulder: Secondary | ICD-10-CM | POA: Diagnosis not present

## 2018-10-08 DIAGNOSIS — M25512 Pain in left shoulder: Secondary | ICD-10-CM | POA: Diagnosis not present

## 2018-11-09 ENCOUNTER — Other Ambulatory Visit: Payer: Self-pay | Admitting: Cardiology

## 2019-04-01 ENCOUNTER — Ambulatory Visit: Payer: 59 | Admitting: Cardiology

## 2019-05-03 ENCOUNTER — Other Ambulatory Visit: Payer: Self-pay | Admitting: Cardiology

## 2019-05-13 ENCOUNTER — Other Ambulatory Visit: Payer: Self-pay

## 2019-05-13 ENCOUNTER — Ambulatory Visit (INDEPENDENT_AMBULATORY_CARE_PROVIDER_SITE_OTHER): Payer: 59 | Admitting: Cardiology

## 2019-05-13 ENCOUNTER — Encounter: Payer: Self-pay | Admitting: Cardiology

## 2019-05-13 VITALS — BP 130/76 | HR 67 | Ht 74.0 in | Wt 224.0 lb

## 2019-05-13 DIAGNOSIS — G4733 Obstructive sleep apnea (adult) (pediatric): Secondary | ICD-10-CM | POA: Diagnosis not present

## 2019-05-13 DIAGNOSIS — I480 Paroxysmal atrial fibrillation: Secondary | ICD-10-CM

## 2019-05-13 MED ORDER — DILTIAZEM HCL ER COATED BEADS 120 MG PO CP24: 120.0000 mg | ORAL_CAPSULE | Freq: Every day | ORAL | 3 refills | Status: DC

## 2019-05-13 NOTE — Progress Notes (Signed)
[place]. [address], [address] [place], [place]  [address] Phone: [phone] Fax:  [phone]  Date:  05/13/2019   ID:  Jerry Barry, DOB 08-05-1960, MRN [id number]  PCP:  Lawerance Cruel, MD   History of Present Illness: Jerry Barry is a 58 y.o. male here for follow-up, monitor which demonstrated paroxysmal atrial fibrillation. TSH normal, electrolytes normal. CHADS-Vasc -0. See below for description of original symptoms in October 2015.   Never previously had any cardiovascular issues (I took care of his mother Jerry Barry who unfortunately passed in 2014). He was at the [facility] area show, hot day, sweated and on the drive home began to feel his heart racing, uneasy feeling. He checked his pulse, carotid pulse and it seemed to be fast, slightly irregular at 150 bpm. He pulled over at gas station, drink a full Gatorade and water back-to-back and after approximately 30 minutes this feeling subsided. He then had another episode which woke him up from sleep where his heart felt like it was racing. He felt a uneasy feeling over his left chest, mild pressure-like sensation. Prior to these episodes, he was exercising at the gym on treadmill for 45 minutes able to achieve a heart rate of 130 bpm without any difficulty. He is also felt a sensation of a singular skipping beat, PVC like as well in the evening hours. He has cut out all caffeine, cut out alcohol on the weekend, stopped his vitamin B supplement.  Once the event is gone, he feels fine. He is quite anxious about these episodes. These episodes lasted about 30 minutes. Nonsmoker, no prior cardiac history. No early family history of coronary artery disease.    11/09/14- has felt some episodes of brief arrhythmia. Felt one while watching a movie. Has been tested for sleep apnea and has mild sleep apnea. Is seeing an ENT. We will continue to monitor. When exercising at the gym, heart rate will increase to 118. Overall feels well. No  further episodes of significant chest discomfort. Occasionally stressful situation, traffic for instance may cause some mild fleeting discomfort.  11/20/16-he has been treated for sleep apnea with CPAP mask by Dr. Radford Pax. Overall doing well. No further palpitations. CPAP helped. Liquid minerals now. He feels great.  12/05/2017 - very rare maybe once a week AFIB 3-4 min. Tried to stop dilt but came back. Now QOD.  Doing very well.  Very rare episodes of palpitations.  No chest pain fevers chills nausea vomiting syncope.  Last LDL 130, 0.9 creatinine  05/13/2019-here for the follow-up of paroxysmal atrial fibrillation.  Doing well with CPAP.  At prior visit may be felt A. fib once a week for about 3 minutes. 6 month gone. Last 3 months PVC's more frequent.  No fevers chills nausea vomiting syncope bleeding.  When he was working out more regularly at the gym, his palpitations seem to be lessened.   Wt Readings from Last 3 Encounters:  05/13/19 224 lb (101.6 kg)  12/05/17 222 lb 9.6 oz (101 kg)  02/13/17 227 lb (103 kg)     Past Medical History:  Diagnosis Date   Colon polyps    Diverticulosis    Esophageal stricture    GERD (gastroesophageal reflux disease)    Hemorrhoids    Hiatal hernia    Obesity (BMI 30-39.9) 06/17/2015   OSA (obstructive sleep apnea) 10/04/2014   Mild with AHI 7.7/hr   PVC (premature ventricular contraction)    Sleep apnea  Past Surgical History:  Procedure Laterality Date   arm fx     right arm , 6 th grade    fractured hand Right     Current Outpatient Medications  Medication Sig Dispense Refill   MULTIPLE VITAMIN PO Take by mouth.     omeprazole (PRILOSEC) 10 MG capsule Take 10 mg by mouth every other day.     OVER THE COUNTER MEDICATION Saw Palmetto, 1 gel cap, 160 mg. daily     diltiazem (CARDIZEM CD) 120 MG 24 hr capsule Take 1 capsule (120 mg total) by mouth daily. 90 capsule 3   No current facility-administered medications for this  visit.     Allergies:   No Known Allergies  Social History:  The patient  reports that he has never smoked. He has never used smokeless tobacco. He reports current alcohol use. He reports that he does not use drugs.   Family History  Problem Relation Age of Onset   Colon polyps Father    Diabetes Father    Benign prostatic hyperplasia Father    Colon polyps Sister    Colon polyps Brother    Heart disease Mother        stent   Colitis Mother    Colon cancer Maternal Grandfather    Esophageal cancer Neg Hx    Rectal cancer Neg Hx    Stomach cancer Neg Hx     ROS:  Please see the history of present illness.  All others negative  PHYSICAL EXAM: VS:  BP 130/76    Pulse 67    Ht 6\' 2"  (1.88 m)    Wt 224 lb (101.6 kg)    BMI 28.76 kg/m  GEN: Well nourished, well developed, in no acute distress  HEENT: normal  Neck: no JVD, carotid bruits, or masses Cardiac: RRR; no murmurs, rubs, or gallops,no edema  Respiratory:  clear to auscultation bilaterally, normal work of breathing GI: soft, nontender, nondistended, + BS MS: no deformity or atrophy  Skin: warm and dry, no rash Neuro:  Alert and Oriented x 3, Strength and sensation are intact Psych: euthymic mood, full affect    EKG: Today 05/13/2019-sinus rhythm 67 with no other abnormalities.  11/20/16 shows sinus bradycardia rate 57 with no other significant abnormalities personally viewed-prior 08/05/15-sinus rhythm, 65, no other abnormalities personally viewed-prior 05/26/14-sinus rhythm, 68, no other abnormalities.     Echocardiogram: 05/27/14 - Left ventricle: The cavity size was normal. Systolic function was vigorous. The estimated ejection fraction was in the range of 65% to 70%. Wall motion was normal; there were no regional wall motion abnormalities. Left ventricular diastolic function parameters were normal. - Aortic valve: Trileaflet; normal thickness leaflets. There was no regurgitation. - Aortic  root: The aortic root was normal in size. - Left atrium: The atrium was mildly dilated. - Right ventricle: Systolic function was normal. - Right atrium: The atrium was normal in size. - Tricuspid valve: There was mild regurgitation. - Pulmonary arteries: Systolic pressure was within the normal range. - Inferior vena cava: The vessel was normal in size. - Pericardium, extracardiac: There was no pericardial effusion.  ASSESSMENT AND PLAN:   Paroxysmal atrial fibrillation -He has been feeling some increased palpitations, perhaps PVCs recently.  He would like to go ahead and take the diltiazem CD 120 mg once a day.  This makes sense.  We will get him a new prescription for this.  Eventually, he would like to try again to come back on this medicine  or back off of this medicine. -I reminded him that atrial fibrillation may return with or without the medication. -Perhaps his use of CPAP has helped. -He thought prior trigger may have been alcohol however he has had a couple drinks here and there and has not triggered his atrial fibrillation.  Interestingly, he feels like a finger is pushing on his throat before he will feel a PVC or palpitations.   Obstructive sleep apnea  - Well treated, Dr. Radford Pax.    His prior episode of atrial fibrillation was caught during a sleep study at night.  Doing well, he had this funny dream where he felt like he was smothering.  He woke up in his hose from his CPAP had been pulled out of the machine.  Mild chest pressure-Doing better, no further episodes.  Calcium scoring-0.  We will go ahead and check lab work today.   I will see him back in 12 months. He knows to call me if any worrisome symptoms develop.  Signed, Candee Furbish, MD [facility]  05/13/2019 2:35 PM

## 2019-05-13 NOTE — Patient Instructions (Signed)
Medication Instructions:  Your physician has recommended you make the following change in your medication:  1.) change diltiazem to take EVERY DAY instead of every other day  If you need a refill on your cardiac medications before your next appointment, please call your pharmacy.   Lab work: Today: lipids, cmet, cbc If you have labs (blood work) drawn today and your tests are completely normal, you will receive your results only by: Marland Kitchen MyChart Message (if you have MyChart) OR . A paper copy in the mail If you have any lab test that is abnormal or we need to change your treatment, we will call you to review the results.  Testing/Procedures: none  Follow-Up: At [facility], you and your health needs are our priority.  As part of our continuing mission to provide you with exceptional heart care, we have created designated Provider Care Teams.  These Care Teams include your primary Cardiologist (physician) and Advanced Practice Providers (APPs -  Physician Assistants and Nurse Practitioners) who all work together to provide you with the care you need, when you need it. You will need a follow up appointment in 12 months.  Please call our office 2 months in advance to schedule this appointment.  You may see Candee Furbish, MD or one of the following Advanced Practice Providers on your designated Care Team:   Truitt Merle, NP Cecilie Kicks, NP . Kathyrn Drown, NP  Any Other Special Instructions Will Be Listed Below (If Applicable).

## 2019-05-14 LAB — CBC
Hematocrit: 45.4 % (ref 37.5–51.0)
Hemoglobin: 15.5 g/dL (ref 13.0–17.7)
MCH: 30 pg (ref 26.6–33.0)
MCHC: 34.1 g/dL (ref 31.5–35.7)
MCV: 88 fL (ref 79–97)
Platelets: 285 10*3/uL (ref 150–450)
RBC: 5.17 x10E6/uL (ref 4.14–5.80)
RDW: 12.7 % (ref 11.6–15.4)
WBC: 6.1 10*3/uL (ref 3.4–10.8)

## 2019-05-14 LAB — LIPID PANEL
Chol/HDL Ratio: 5.2 ratio — ABNORMAL HIGH (ref 0.0–5.0)
Cholesterol, Total: 256 mg/dL — ABNORMAL HIGH (ref 100–199)
HDL: 49 mg/dL (ref >39)
LDL Chol Calc (NIH): 174 mg/dL — ABNORMAL HIGH (ref 0–99)
Triglycerides: 177 mg/dL — ABNORMAL HIGH (ref 0–149)
VLDL Cholesterol Cal: 33 mg/dL (ref 5–40)

## 2019-05-14 LAB — COMPREHENSIVE METABOLIC PANEL
ALT: 27 IU/L (ref 0–44)
AST: 22 IU/L (ref 0–40)
Albumin/Globulin Ratio: 2 (ref 1.2–2.2)
Albumin: 4.7 g/dL (ref 3.8–4.9)
Alkaline Phosphatase: 61 IU/L (ref 39–117)
BUN/Creatinine Ratio: 15 (ref 9–20)
BUN: 14 mg/dL (ref 6–24)
Bilirubin Total: 0.5 mg/dL (ref 0.0–1.2)
CO2: 22 mmol/L (ref 20–29)
Calcium: 9.3 mg/dL (ref 8.7–10.2)
Chloride: 102 mmol/L (ref 96–106)
Creatinine, Ser: 0.91 mg/dL (ref 0.76–1.27)
GFR calc Af Amer: 106 mL/min/{1.73_m2} (ref >59)
GFR calc non Af Amer: 92 mL/min/{1.73_m2} (ref >59)
Globulin, Total: 2.3 g/dL (ref 1.5–4.5)
Glucose: 91 mg/dL (ref 65–99)
Potassium: 4.3 mmol/L (ref 3.5–5.2)
Sodium: 140 mmol/L (ref 134–144)
Total Protein: 7 g/dL (ref 6.0–8.5)

## 2019-05-15 ENCOUNTER — Other Ambulatory Visit: Payer: Self-pay | Admitting: *Deleted

## 2019-05-15 ENCOUNTER — Telehealth: Payer: Self-pay | Admitting: Cardiology

## 2019-05-15 DIAGNOSIS — Z79899 Other long term (current) drug therapy: Secondary | ICD-10-CM

## 2019-05-15 DIAGNOSIS — E782 Mixed hyperlipidemia: Secondary | ICD-10-CM

## 2019-05-15 MED ORDER — ROSUVASTATIN CALCIUM 5 MG PO TABS: 5.0000 mg | ORAL_TABLET | Freq: Every day | ORAL | 3 refills | Status: DC

## 2019-05-15 NOTE — Telephone Encounter (Signed)
Spoke with patient who has reconsidered and would rather try diet before starting a medication for his cholesterol.  He reports he knows there are several thing he could change before starting a medications and his is very leary of starting a medication as this time.  Advised pt OK to try diet changes for now but to keep follow up lab appt as scheduled.  He states understanding and will c/b if any further questions or concerns.  Will forward to Dr Marlou Porch for his knowledge.

## 2019-05-15 NOTE — Telephone Encounter (Signed)
New Message  Patient is calling back because he has some additional questions in reference to his lab works.

## 2019-05-18 NOTE — Telephone Encounter (Signed)
Thanks for update °Jeet Shough, MD ° °

## 2019-08-18 ENCOUNTER — Other Ambulatory Visit: Payer: 59

## 2019-08-18 ENCOUNTER — Encounter (INDEPENDENT_AMBULATORY_CARE_PROVIDER_SITE_OTHER): Payer: Self-pay

## 2019-08-18 ENCOUNTER — Other Ambulatory Visit: Payer: Self-pay

## 2019-08-18 DIAGNOSIS — Z79899 Other long term (current) drug therapy: Secondary | ICD-10-CM

## 2019-08-18 DIAGNOSIS — E782 Mixed hyperlipidemia: Secondary | ICD-10-CM

## 2019-08-18 LAB — LIPID PANEL
Chol/HDL Ratio: 4 ratio (ref 0.0–5.0)
Cholesterol, Total: 189 mg/dL (ref 100–199)
HDL: 47 mg/dL (ref >39)
LDL Chol Calc (NIH): 128 mg/dL — ABNORMAL HIGH (ref 0–99)
Triglycerides: 76 mg/dL (ref 0–149)
VLDL Cholesterol Cal: 14 mg/dL (ref 5–40)

## 2019-08-18 LAB — ALT: ALT: 21 IU/L (ref 0–44)

## 2019-08-19 ENCOUNTER — Telehealth: Payer: Self-pay

## 2019-08-19 NOTE — Telephone Encounter (Signed)
-----   Message from Jerline Pain, MD sent at 08/19/2019  3:42 PM EST ----- LDL decreased from the 170s to 128 with 5 mg of Crestor.  Continue current plan. Candee Furbish, MD

## 2019-08-19 NOTE — Telephone Encounter (Signed)
The patient has been notified of the lab result and verbalized understanding.  All questions (if any) were answered. Frederik Schmidt, RN 08/19/2019 3:56 PM

## 2019-12-23 NOTE — Progress Notes (Signed)
Virtual Visit via Telephone Note   This visit type was conducted due to national recommendations for restrictions regarding the COVID-19 Pandemic (e.g. social distancing) in an effort to limit this patient's exposure and mitigate transmission in our community.  Due to his co-morbid illnesses, this patient is at least at moderate risk for complications without adequate follow up.  This format is felt to be most appropriate for this patient at this time.  The patient did not have access to video technology/had technical difficulties with video requiring transitioning to audio format only (telephone).  All issues noted in this document were discussed and addressed.  No physical exam could be performed with this format.  Please refer to the patient's chart for his  consent to telehealth for [facility].  Evaluation Performed:  Follow-up visit  This visit type was conducted due to national recommendations for restrictions regarding the COVID-19 Pandemic (e.g. social distancing).  This format is felt to be most appropriate for this patient at this time.  All issues noted in this document were discussed and addressed.  No physical exam was performed (except for noted visual exam findings with Video Visits).  Please refer to the patient's chart (MyChart message for video visits and phone note for telephone visits) for the patient's consent to telehealth for [facility]  Date:  12/24/2019   ID:  Jerry Barry, DOB 09/11/1960, MRN [id number]  Patient Location:  Home  Provider location:   [place]  PCP:  Lawerance Cruel, MD  Cardiologist:  Candee Furbish, MD  Sleep Medicine:  Fransico Him, MD Electrophysiologist:  None   Chief Complaint:  OSA  History of Present Illness:    Jerry Barry is a 59 y.o. male who presents via audio/video conferencing for a telehealth visit today.    Jerry Barry is a 59 y.o. male who presents for followup of obstructive sleep apnea. He has mild OSA with an  AHI of 7.7 events per hour on PSG with mild intermittent snoring and no significant oxygen desaturations.He is on CPAP at 11cm H2O.  He is doing well with his CPAP device and thinks that he has gotten used to it.  He tolerates the mask and feels the pressure is adequate.  Since going on CPAP he feels rested in the am and has no significant daytime sleepiness.  He has some mouth dryness but this is only some nights.  He does not think that he snores.    Prior CV studies:   The following studies were reviewed today:  PAP compliance download  Past Medical History:  Diagnosis Date  . Colon polyps   . Diverticulosis   . Esophageal stricture   . GERD (gastroesophageal reflux disease)   . Hemorrhoids   . Hiatal hernia   . Obesity (BMI 30-39.9) 06/17/2015  . OSA (obstructive sleep apnea) 10/04/2014   Mild with AHI 7.7/hr  . PVC (premature ventricular contraction)   . Sleep apnea    Past Surgical History:  Procedure Laterality Date  . arm fx     right arm , 6 th grade   . fractured hand Right      Current Meds  Medication Sig  . diltiazem (CARDIZEM CD) 120 MG 24 hr capsule Take 1 capsule (120 mg total) by mouth daily.  . MULTIPLE VITAMIN PO Take by mouth.  Marland Kitchen omeprazole (PRILOSEC) 10 MG capsule Take 10 mg by mouth every other day.  Marland Kitchen OVER THE COUNTER MEDICATION Saw Palmetto, 1 gel cap,  160 mg. daily     Allergies:   Patient has no known allergies.   Social History   Tobacco Use  . Smoking status: Never Smoker  . Smokeless tobacco: Never Used  Substance Use Topics  . Alcohol use: Yes    Comment: socially  . Drug use: No     Family Hx: The patient's family history includes Benign prostatic hyperplasia in his father; Colitis in his mother; Colon cancer in his maternal grandfather; Colon polyps in his brother, father, and sister; Diabetes in his father; Heart disease in his mother. There is no history of Esophageal cancer, Rectal cancer, or Stomach cancer.  ROS:   Please see  the history of present illness.     All other systems reviewed and are negative.   Labs/Other Tests and Data Reviewed:    Recent Labs: 05/13/2019: BUN 14; Creatinine, Ser 0.91; Hemoglobin 15.5; Platelets 285; Potassium 4.3; Sodium 140 08/18/2019: ALT 21   Recent Lipid Panel Lab Results  Component Value Date/Time   CHOL 189 08/18/2019 08:58 AM   TRIG 76 08/18/2019 08:58 AM   HDL 47 08/18/2019 08:58 AM   CHOLHDL 4.0 08/18/2019 08:58 AM   LDLCALC 128 (H) 08/18/2019 08:58 AM    Wt Readings from Last 3 Encounters:  12/24/19 218 lb (98.9 kg)  05/13/19 224 lb (101.6 kg)  12/05/17 222 lb 9.6 oz (101 kg)     Objective:    Vital Signs:  Ht 6\' 2"  (1.88 m)   Wt 218 lb (98.9 kg)   BMI 27.99 kg/m    ASSESSMENT & PLAN:    1.  OSA -  The patient is tolerating PAP therapy well without any problems. The PAP download was reviewed today and showed an AHI of 0.9/hr on 11 cm H2O with 97% compliance in using more than 4 hours nightly.  The patient has been using and benefiting from PAP use and will continue to benefit from therapy.   Time:   Today, I have spent 15 minutes on telemedicine discussing medical problems including OSA and reviewing patient's chart including PAP compliance download.  Medication Adjustments/Labs and Tests Ordered: Current medicines are reviewed at length with the patient today.  Concerns regarding medicines are outlined above.  Tests Ordered: No orders of the defined types were placed in this encounter.  Medication Changes: No orders of the defined types were placed in this encounter.   Disposition:  Follow up in 1 year(s)  Signed, Fransico Him, MD  12/24/2019 8:13 AM    [facility]

## 2019-12-24 ENCOUNTER — Other Ambulatory Visit: Payer: Self-pay

## 2019-12-24 ENCOUNTER — Telehealth (INDEPENDENT_AMBULATORY_CARE_PROVIDER_SITE_OTHER): Payer: 59 | Admitting: Cardiology

## 2019-12-24 ENCOUNTER — Encounter: Payer: Self-pay | Admitting: Cardiology

## 2019-12-24 ENCOUNTER — Telehealth: Payer: Self-pay | Admitting: *Deleted

## 2019-12-24 VITALS — Ht 74.0 in | Wt 218.0 lb

## 2019-12-24 DIAGNOSIS — G4733 Obstructive sleep apnea (adult) (pediatric): Secondary | ICD-10-CM | POA: Diagnosis not present

## 2019-12-24 NOTE — Telephone Encounter (Signed)
Order placed to [employer] for PAP supplies and get a chin strap

## 2019-12-24 NOTE — Telephone Encounter (Signed)
-----   Message from Sueanne Margarita, MD sent at 12/24/2019  8:17 AM EDT ----- Please renew PAP supplies and get a chin strap

## 2020-03-04 ENCOUNTER — Telehealth: Payer: Self-pay | Admitting: Cardiology

## 2020-03-04 NOTE — Telephone Encounter (Signed)
Pt c/o medication issue:  1. Name of Medication: diltiazem (CARDIZEM CD) 120 MG 24 hr capsule  2. How are you currently taking this medication (dosage and times per day)? Pt taking 2 pills daily at night for the past ~2 weeks  3. Are you having a reaction (difficulty breathing--STAT)? no  4. What is your medication issue? Patient wanted to know if he needed to adjust his medication again or if Dr. Marlou Porch wants him to come in for an appointment    Patient c/o Palpitations:  High priority if patient c/o lightheadedness, shortness of breath, or chest pain  1) How long have you had palpitations/irregular HR/ Afib? Are you having the symptoms now? Not at the time of call  2) Are you currently experiencing lightheadedness, SOB or CP?   3) Do you have a history of afib (atrial fibrillation) or irregular heart rhythm? yes  4) Have you checked your BP or HR? (document readings if available):   5) Are you experiencing any other symptoms? No   Patient said ~1.5 years ago he started noticing more frequent irregularities in his HR. When this happened, his medication was adjusted. He started to have similar symptoms again so he decided on his own to start doubling up on his medication. He wanted to know if Dr. Marlou Porch was OK with him doing this or if Dr. Marlou Porch wants him to come in for an appointment

## 2020-03-04 NOTE — Telephone Encounter (Signed)
Spoke with pt regarding his s/s.  He reports he went back into the gym about 2 & 1/2 months ago.  Since then he has noticed an increase in the skipping feeling in his heartbeat.  He reports the only thing he has been doing differently is use a protein supplement and creatine monohydrate for muscle recovery.  He can tell when it is going to happen because he gets a funny feeling in his chest - almost like a pressure.  3 nights ago it occurred so much he had to get out of bed and walk around.  He did this, got something to drink and things settled down - he was able to go back to sleep.  Advised to continue taking Diltiazem 120 mg (2) QHS as verbally ordered by Dr Marlou Porch.  Advised I will have him review this info and c/b with any new orders.  He is not on anti-coagulant.  appt scheduled with Dr Marlou Porch on Tuesday.  Pt is agreeable and grateful for the c/b

## 2020-03-07 NOTE — Telephone Encounter (Signed)
Agree with plan. Has office visit tomorrow. Candee Furbish, MD

## 2020-03-08 ENCOUNTER — Ambulatory Visit (INDEPENDENT_AMBULATORY_CARE_PROVIDER_SITE_OTHER): Payer: 59 | Admitting: Cardiology

## 2020-03-08 ENCOUNTER — Encounter: Payer: Self-pay | Admitting: Cardiology

## 2020-03-08 ENCOUNTER — Other Ambulatory Visit: Payer: Self-pay

## 2020-03-08 VITALS — BP 114/70 | HR 66 | Ht 74.0 in | Wt 221.0 lb

## 2020-03-08 DIAGNOSIS — I480 Paroxysmal atrial fibrillation: Secondary | ICD-10-CM | POA: Diagnosis not present

## 2020-03-08 DIAGNOSIS — Z79899 Other long term (current) drug therapy: Secondary | ICD-10-CM | POA: Diagnosis not present

## 2020-03-08 DIAGNOSIS — G4733 Obstructive sleep apnea (adult) (pediatric): Secondary | ICD-10-CM | POA: Diagnosis not present

## 2020-03-08 NOTE — Progress Notes (Signed)
Cardiology Office Note:    Date:  03/08/2020   ID:  Jerry Barry, DOB 10-30-1960, MRN [id number]  PCP:  Lawerance Cruel, MD  [place] Cardiologist:  Candee Furbish, MD  [facility] Electrophysiologist:  None   Referring MD: Lawerance Cruel, MD     History of Present Illness:    Jerry Barry is a 59 y.o. male here for follow-up of paroxysmal atrial fibrillation.  In review of prior office note, a few years ago was in [place] on a hot day sweating began to feel his heart race checked his pulse and it was irregular at 150.  Episode of rapid heart rate lasted about 30 minutes.  Another time happened while watching a movie.  He was tested for sleep apnea but this was only mild.  Previously he feels atrial fibrillation maybe about once a week for 3 minutes.  PVCs have been more frequent as well.  Please see below for details. Eager to talk with EP.   Past Medical History:  Diagnosis Date  . Colon polyps   . Diverticulosis   . Esophageal stricture   . GERD (gastroesophageal reflux disease)   . Hemorrhoids   . Hiatal hernia   . Obesity (BMI 30-39.9) 06/17/2015  . OSA (obstructive sleep apnea) 10/04/2014   Mild with AHI 7.7/hr  . PVC (premature ventricular contraction)   . Sleep apnea     Past Surgical History:  Procedure Laterality Date  . arm fx     right arm , 6 th grade   . fractured hand Right     Current Medications: Current Meds  Medication Sig  . diltiazem (CARDIZEM CD) 120 MG 24 hr capsule Take 1 capsule (120 mg total) by mouth daily.  . MULTIPLE VITAMIN PO Take by mouth.  Marland Kitchen omeprazole (PRILOSEC) 10 MG capsule Take 10 mg by mouth every other day.  Marland Kitchen OVER THE COUNTER MEDICATION Saw Palmetto, 1 gel cap, 160 mg. daily     Allergies:   Patient has no known allergies.   Social History   Socioeconomic History  . Marital status: Single    Spouse name: Not on file  . Number of children: 0  . Years of education: Not on file  . Highest  education level: Not on file  Occupational History    Employer: [employer]  Tobacco Use  . Smoking status: Never Smoker  . Smokeless tobacco: Never Used  Vaping Use  . Vaping Use: Never used  Substance and Sexual Activity  . Alcohol use: Yes    Comment: socially  . Drug use: No  . Sexual activity: Not on file  Other Topics Concern  . Not on file  Social History Narrative  . Not on file   Social Determinants of Health   Financial Resource Strain:   . Difficulty of Paying Living Expenses:   Food Insecurity:   . Worried About Charity fundraiser in the Last Year:   . Arboriculturist in the Last Year:   Transportation Needs:   . Film/video editor (Medical):   Marland Kitchen Lack of Transportation (Non-Medical):   Physical Activity:   . Days of Exercise per Week:   . Minutes of Exercise per Session:   Stress:   . Feeling of Stress :   Social Connections:   . Frequency of Communication with Friends and Family:   . Frequency of Social Gatherings with Friends and Family:   . Attends Religious Services:   .  Active Member of Clubs or Organizations:   . Attends Archivist Meetings:   Marland Kitchen Marital Status:      Family History: The patient's family history includes Benign prostatic hyperplasia in his father; Colitis in his mother; Colon cancer in his maternal grandfather; Colon polyps in his brother, father, and sister; Diabetes in his father; Heart disease in his mother. There is no history of Esophageal cancer, Rectal cancer, or Stomach cancer.  ROS:   Please see the history of present illness.     All other systems reviewed and are negative.  EKGs/Labs/Other Studies Reviewed:    The following studies were reviewed today: Echo 2015 -EF 70% left atrium mildly dilated mild tricuspid regurgitation  EKG:  EKG is  ordered today.  The ekg ordered today demonstrates sinus rhythm 66 with no other abnormalities 05/13/2019 shows sinus rhythm 67 no other abnormalities  Recent  Labs: 05/13/2019: BUN 14; Creatinine, Ser 0.91; Hemoglobin 15.5; Platelets 285; Potassium 4.3; Sodium 140 08/18/2019: ALT 21  Recent Lipid Panel    Component Value Date/Time   CHOL 189 08/18/2019 0858   TRIG 76 08/18/2019 0858   HDL 47 08/18/2019 0858   CHOLHDL 4.0 08/18/2019 0858   LDLCALC 128 (H) 08/18/2019 0858    Physical Exam:    VS:  BP 114/70   Pulse 66   Ht 6\' 2"  (1.88 m)   Wt 221 lb (100.2 kg)   SpO2 97%   BMI 28.37 kg/m     Wt Readings from Last 3 Encounters:  03/08/20 221 lb (100.2 kg)  12/24/19 218 lb (98.9 kg)  05/13/19 224 lb (101.6 kg)     GEN:  Well nourished, well developed in no acute distress HEENT: Normal NECK: No JVD; No carotid bruits LYMPHATICS: No lymphadenopathy CARDIAC: RRR, no murmurs, rubs, gallops RESPIRATORY:  Clear to auscultation without rales, wheezing or rhonchi  ABDOMEN: Soft, non-tender, non-distended MUSCULOSKELETAL:  No edema; No deformity  SKIN: Warm and dry NEUROLOGIC:  Alert and oriented x 3 PSYCHIATRIC:  Normal affect   ASSESSMENT:    1. Paroxysmal atrial fibrillation (HCC)   2. Long-term use of high-risk medication   3. OSA (obstructive sleep apnea)    PLAN:    In order of problems listed above:  Paroxysmal atrial fibrillation -Continues to feel more episodes. -Previously had given him diltiazem to take. --Dehydration is a big trigger --Gym lost 15 pounds. Protein, creatine muscle  -Using CPAP -He would like to go ahead and have a referral for electrophysiology.  I think this makes sense.  If he is having paroxysmal atrial fibrillation episodes, he is amenable to ablative therapy.  I explained to him possibilities for antiarrhythmics as well, flecainide for instance which reduce atrial fibrillation burden as well as PVC burden.  He would like to try to come off of medications if possible. -CHA2DS2-VASc is 0.  No anticoagulation currently. -He may very well need an up-to-date monitor, I will let EP decide. -Monitor  in 2015, see epic, demonstrated episodes of atrial fibrillation paroxysmal.  Obstructive sleep apnea -Dr. Radford Pax  Coronary calcium score 0     Medication Adjustments/Labs and Tests Ordered: Current medicines are reviewed at length with the patient today.  Concerns regarding medicines are outlined above.  Orders Placed This Encounter  Procedures  . Ambulatory referral to Cardiac Electrophysiology  . EKG 12-Lead   No orders of the defined types were placed in this encounter.   Patient Instructions  Medication Instructions:  The current medical regimen is  effective;  continue present plan and medications.  *If you need a refill on your cardiac medications before your next appointment, please call your pharmacy*  You have been referred to be seen by electrophysiology - Dr. Quentin Ore  Follow-Up: At [facility], you and your health needs are our priority.  As part of our continuing mission to provide you with exceptional heart care, we have created designated Provider Care Teams.  These Care Teams include your primary Cardiologist (physician) and Advanced Practice Providers (APPs -  Physician Assistants and Nurse Practitioners) who all work together to provide you with the care you need, when you need it.  We recommend signing up for the patient portal called "MyChart".  Sign up information is provided on this After Visit Summary.  MyChart is used to connect with patients for Virtual Visits (Telemedicine).  Patients are able to view lab/test results, encounter notes, upcoming appointments, etc.  Non-urgent messages can be sent to your provider as well.   To learn more about what you can do with MyChart, go to [email or web address].    Your next appointment:   12 month(s)  The format for your next appointment:   In Person  Provider:   Candee Furbish, MD   Thank you for choosing [facility]!!         Signed, Candee Furbish, MD  03/08/2020 1:22 PM    [place]

## 2020-03-08 NOTE — Patient Instructions (Signed)
Medication Instructions:  The current medical regimen is effective;  continue present plan and medications.  *If you need a refill on your cardiac medications before your next appointment, please call your pharmacy*  You have been referred to be seen by electrophysiology - Dr. Quentin Ore  Follow-Up: At [facility], you and your health needs are our priority.  As part of our continuing mission to provide you with exceptional heart care, we have created designated Provider Care Teams.  These Care Teams include your primary Cardiologist (physician) and Advanced Practice Providers (APPs -  Physician Assistants and Nurse Practitioners) who all work together to provide you with the care you need, when you need it.  We recommend signing up for the patient portal called "MyChart".  Sign up information is provided on this After Visit Summary.  MyChart is used to connect with patients for Virtual Visits (Telemedicine).  Patients are able to view lab/test results, encounter notes, upcoming appointments, etc.  Non-urgent messages can be sent to your provider as well.   To learn more about what you can do with MyChart, go to [email or web address].    Your next appointment:   12 month(s)  The format for your next appointment:   In Person  Provider:   Candee Furbish, MD   Thank you for choosing [facility]

## 2020-03-26 NOTE — Progress Notes (Signed)
Electrophysiology Office Note:    Date:  03/28/2020   ID:  BRAY Jerry Barry, DOB 1960/09/07, MRN [id number]  PCP:  Jerry Cruel, MD  [facility] Cardiologist:  Jerry Furbish, MD  [facility] Electrophysiologist:  Vickie Epley, MD   Referring MD: Jerry Pain, MD   Chief Complaint: AF  History of Present Illness:    Jerry Barry is a 59 y.o. male with a hx of palpitations dating back to 40. Episodes have previously been attributed to PVC and possible AF. Alcohol and supplements (protein powder, recovery drinks) greatly increases the frequency of the abnormal rhythms. They happen 4-5 times per week. Sometimes he has woken up from sleep and had to change positions to avoid feeling the abnormal heart beats. No syncope. He feels a fullness in the throat when he has episodes and feels the urge to cough.  He is interested in figuring out what these palpitations are from and to discuss ablation therapy given his desire to avoid medications and their related side effect profiles.  Past Medical History:  Diagnosis Date  . Colon polyps   . Diverticulosis   . Esophageal stricture   . GERD (gastroesophageal reflux disease)   . Hemorrhoids   . Hiatal hernia   . Obesity (BMI 30-39.9) 06/17/2015  . OSA (obstructive sleep apnea) 10/04/2014   Mild with AHI 7.7/hr  . PVC (premature ventricular contraction)   . Sleep apnea     Past Surgical History:  Procedure Laterality Date  . arm fx     right arm , 6 th grade   . fractured hand Right     Current Medications: Current Meds  Medication Sig  . diltiazem (CARDIZEM CD) 120 MG 24 hr capsule Take 1 capsule (120 mg total) by mouth daily.  . MULTIPLE VITAMIN PO Take by mouth.  Marland Kitchen omeprazole (PRILOSEC) 10 MG capsule Take 10 mg by mouth every other day.  Marland Kitchen OVER THE COUNTER MEDICATION Saw Palmetto, 1 gel cap, 160 mg. daily     Allergies:   Patient has no known allergies.   Social History   Socioeconomic History  . Marital  status: Single    Spouse name: Not on file  . Number of children: 0  . Years of education: Not on file  . Highest education level: Not on file  Occupational History    Employer: [employer]  Tobacco Use  . Smoking status: Never Smoker  . Smokeless tobacco: Never Used  Vaping Use  . Vaping Use: Never used  Substance and Sexual Activity  . Alcohol use: Yes    Comment: socially  . Drug use: No  . Sexual activity: Not on file  Other Topics Concern  . Not on file  Social History Narrative  . Not on file   Social Determinants of Health   Financial Resource Strain:   . Difficulty of Paying Living Expenses: Not on file  Food Insecurity:   . Worried About Charity fundraiser in the Last Year: Not on file  . Ran Out of Food in the Last Year: Not on file  Transportation Needs:   . Lack of Transportation (Medical): Not on file  . Lack of Transportation (Non-Medical): Not on file  Physical Activity:   . Days of Exercise per Week: Not on file  . Minutes of Exercise per Session: Not on file  Stress:   . Feeling of Stress : Not on file  Social Connections:   . Frequency of Communication  with Friends and Family: Not on file  . Frequency of Social Gatherings with Friends and Family: Not on file  . Attends Religious Services: Not on file  . Active Member of Clubs or Organizations: Not on file  . Attends Archivist Meetings: Not on file  . Marital Status: Not on file     Family History: The patient's family history includes Benign prostatic hyperplasia in his father; Colitis in his mother; Colon cancer in his maternal grandfather; Colon polyps in his brother, father, and sister; Diabetes in his father; Heart disease in his mother. There is no history of Esophageal cancer, Rectal cancer, or Stomach cancer.  ROS:   Please see the history of present illness.    All other systems reviewed and are negative.  EKGs/Labs/Other Studies Reviewed:    The following studies were  reviewed today: ECG, echo  EKG:  The ekg ordered today demonstrates sinus rhythm with a single PVC (LBBB pattern in V1).  05/27/2014 Echo - Left ventricle: The cavity size was normal. Systolic function was  vigorous. The estimated ejection fraction was in the range of 65%  to 70%. Wall motion was normal; there were no regional wall  motion abnormalities. Left ventricular diastolic function  parameters were normal.  - Aortic valve: Trileaflet; normal thickness leaflets. There was no  regurgitation.  - Aortic root: The aortic root was normal in size.  - Left atrium: The atrium was mildly dilated.  - Right ventricle: Systolic function was normal.  - Right atrium: The atrium was normal in size.  - Tricuspid valve: There was mild regurgitation.  - Pulmonary arteries: Systolic pressure was within the normal  range.  - Inferior vena cava: The vessel was normal in size.  - Pericardium, extracardiac: There was no pericardial effusion.  03/08/2020 ECG: sinus rhythm at 66.   Recent Labs: 05/13/2019: BUN 14; Creatinine, Ser 0.91; Hemoglobin 15.5; Platelets 285; Potassium 4.3; Sodium 140 08/18/2019: ALT 21  Recent Lipid Panel    Component Value Date/Time   CHOL 189 08/18/2019 0858   TRIG 76 08/18/2019 0858   HDL 47 08/18/2019 0858   CHOLHDL 4.0 08/18/2019 0858   LDLCALC 128 (H) 08/18/2019 0858    Physical Exam:    VS:  BP 120/76   Pulse 61   Ht 6\' 2"  (1.88 m)   Wt 220 lb 6.4 oz (100 kg)   SpO2 98%   BMI 28.30 kg/m     Wt Readings from Last 3 Encounters:  03/28/20 220 lb 6.4 oz (100 kg)  03/08/20 221 lb (100.2 kg)  12/24/19 218 lb (98.9 kg)     GEN: Well nourished, well developed in no acute distress HEENT: Normal NECK: No JVD; No carotid bruits LYMPHATICS: No lymphadenopathy CARDIAC: RRR, no murmurs, rubs, gallops RESPIRATORY:  Clear to auscultation without rales, wheezing or rhonchi  ABDOMEN: Soft, non-tender, non-distended MUSCULOSKELETAL:  No edema; No  deformity  SKIN: Warm and dry NEUROLOGIC:  Alert and oriented x 3 PSYCHIATRIC:  Normal affect   ASSESSMENT:    1. Palpitations    PLAN:    In order of problems listed above:  1. Palpitations Patient reports irregular beats at times with an increased "pounding" in the chest. These are most likely PVCs (ECG limited leads showing PVC consistent with outflow tract origin) although his symptoms could be AF. Prefers to manage without medications. I want to start with a 2 week monitor to try to see what rhythm is causing his symptoms. We will also  check his echo to confirm no structural abnormalities although I think this is unlikely. Will plan to see back in 1 month to discuss results and discuss treatment options.   Medication Adjustments/Labs and Tests Ordered: Current medicines are reviewed at length with the patient today.  Concerns regarding medicines are outlined above.  Orders Placed This Encounter  Procedures  . LONG TERM MONITOR (3-14 DAYS)  . EKG 12-Lead  . ECHOCARDIOGRAM COMPLETE   No orders of the defined types were placed in this encounter.   Patient Instructions  Medication Instructions:  Your physician recommends that you continue on your current medications as directed. Please refer to the Current Medication list given to you today.  Labwork: None ordered.  Testing/Procedures: Your physician has requested that you have an echocardiogram. Echocardiography is a painless test that uses sound waves to create images of your heart. It provides your doctor with information about the size and shape of your heart and how well your heart's chambers and valves are working. This procedure takes approximately one hour. There are no restrictions for this procedure.  Please schedule for ECHO  Your physician has recommended that you wear a holter monitor. Holter monitors are medical devices that record the heart's electrical activity.   You will wear this heart monitor for 14  days.   Follow-Up:  Your physician wants you to follow-up in: 2 months with Dr. Quentin Ore at the [facility] office.   Any Other Special Instructions Will Be Listed Below (If Applicable).  If you need a refill on your cardiac medications before your next appointment, please call your pharmacy.        Signed, Vickie Epley, MD  03/28/2020 10:56 AM    [place]

## 2020-03-28 ENCOUNTER — Encounter: Payer: Self-pay | Admitting: *Deleted

## 2020-03-28 ENCOUNTER — Encounter: Payer: Self-pay | Admitting: Cardiology

## 2020-03-28 ENCOUNTER — Ambulatory Visit (INDEPENDENT_AMBULATORY_CARE_PROVIDER_SITE_OTHER): Payer: 59 | Admitting: Cardiology

## 2020-03-28 ENCOUNTER — Other Ambulatory Visit: Payer: Self-pay

## 2020-03-28 ENCOUNTER — Telehealth: Payer: Self-pay | Admitting: *Deleted

## 2020-03-28 VITALS — BP 120/76 | HR 61 | Ht 74.0 in | Wt 220.4 lb

## 2020-03-28 DIAGNOSIS — R002 Palpitations: Secondary | ICD-10-CM | POA: Diagnosis not present

## 2020-03-28 NOTE — Telephone Encounter (Signed)
     Pt returning call from [place], he gave land address to send heart monitor [address] [place] [address]

## 2020-03-28 NOTE — Patient Instructions (Addendum)
Medication Instructions:  Your physician recommends that you continue on your current medications as directed. Please refer to the Current Medication list given to you today.  Labwork: None ordered.  Testing/Procedures: Your physician has requested that you have an echocardiogram. Echocardiography is a painless test that uses sound waves to create images of your heart. It provides your doctor with information about the size and shape of your heart and how well your heart's chambers and valves are working. This procedure takes approximately one hour. There are no restrictions for this procedure.  Please schedule for ECHO  Your physician has recommended that you wear a holter monitor. Holter monitors are medical devices that record the heart's electrical activity.   You will wear this heart monitor for 14 days.   Follow-Up:  Your physician wants you to follow-up in: 6-8 weeks months with Dr. Quentin Ore at the [facility] office.   Any Other Special Instructions Will Be Listed Below (If Applicable).  If you need a refill on your cardiac medications before your next appointment, please call your pharmacy.   ZIO XT- Long Term Monitor Instructions   Your physician has requested you wear your ZIO patch monitor_14_days.   This is a single patch monitor.  [employer] supplies one patch monitor per enrollment.  Additional stickers are not available.   Please do not apply patch if you will be having a Nuclear Stress Test, Echocardiogram, Cardiac CT, MRI, or Chest Xray during the time frame you would be wearing the monitor. The patch cannot be worn during these tests.  You cannot remove and re-apply the ZIO XT patch monitor.   Your ZIO patch monitor will be sent USPS Priority mail from [facility] directly to your home address. The monitor may also be mailed to a PO BOX if home delivery is not available.   It may take 3-5 days to receive your monitor after you have been enrolled.   Once you  have received you monitor, please review enclosed instructions.  Your monitor has already been registered assigning a specific monitor serial # to you.   Applying the monitor   Shave hair from upper left chest.   Hold abrader disc by orange tab.  Rub abrader in 40 strokes over left upper chest as indicated in your monitor instructions.   Clean area with 4 enclosed alcohol pads .  Use all pads to assure are is cleaned thoroughly.  Let dry.   Apply patch as indicated in monitor instructions.  Patch will be place under collarbone on left side of chest with arrow pointing upward.   Rub patch adhesive wings for 2 minutes.Remove white label marked "1".  Remove white label marked "2".  Rub patch adhesive wings for 2 additional minutes.   While looking in a mirror, press and release button in center of patch.  A small green light will flash 3-4 times .  This will be your only indicator the monitor has been turned on.     Do not shower for the first 24 hours.  You may shower after the first 24 hours.   Press button if you feel a symptom. You will hear a small click.  Record Date, Time and Symptom in the Patient Log Book.   When you are ready to remove patch, follow instructions on last 2 pages of Patient Log Book.  Stick patch monitor onto last page of Patient Log Book.   Place Patient Log Book in [place] box.  Use locking tab on box  and tape box closed securely.  The [employer] and [employer] has [employer] on it.  Please place in mailbox as soon as possible.  Your physician should have your test results approximately 7 days after the monitor has been mailed back to [facility].   Call [place] at [phone] if you have questions regarding your ZIO XT patch monitor.  Call them immediately if you see an orange light blinking on your monitor.   If your monitor falls off in less than 4 days contact our Monitor department at [phone].  If your monitor becomes loose or falls  off after 4 days call [employer] at [phone] for suggestions on securing your monitor.

## 2020-03-28 NOTE — Progress Notes (Signed)
Patient ID: Jerry Barry, male   DOB: May 01, 1961, 59 y.o.   MRN: [id number] Patient enrolled for [employer] to ship a 14 day ZIO XT long term holter monitor to [place], [place], [place] [address]

## 2020-03-28 NOTE — Telephone Encounter (Signed)
LMVM- Attempting to have [employer] ship 14 day ZIO XT long term holter monitor to home.  [employer] ships FED EX.  We will need a land address to ship.  FED EX will not deliver to PO BOX.

## 2020-04-14 ENCOUNTER — Other Ambulatory Visit: Payer: Self-pay

## 2020-04-14 ENCOUNTER — Ambulatory Visit (HOSPITAL_COMMUNITY): Payer: 59 | Attending: Cardiology

## 2020-04-14 DIAGNOSIS — R002 Palpitations: Secondary | ICD-10-CM | POA: Diagnosis not present

## 2020-04-14 LAB — ECHOCARDIOGRAM COMPLETE
Area-P 1/2: 2.96 cm2
P 1/2 time: 822 msec
S' Lateral: 3.7 cm

## 2020-04-15 ENCOUNTER — Ambulatory Visit (INDEPENDENT_AMBULATORY_CARE_PROVIDER_SITE_OTHER): Payer: 59

## 2020-04-15 DIAGNOSIS — I4891 Unspecified atrial fibrillation: Secondary | ICD-10-CM | POA: Diagnosis not present

## 2020-04-15 DIAGNOSIS — R002 Palpitations: Secondary | ICD-10-CM | POA: Diagnosis not present

## 2020-05-10 ENCOUNTER — Other Ambulatory Visit: Payer: Self-pay | Admitting: Cardiology

## 2020-05-10 DIAGNOSIS — I4891 Unspecified atrial fibrillation: Secondary | ICD-10-CM

## 2020-05-10 DIAGNOSIS — R002 Palpitations: Secondary | ICD-10-CM

## 2020-05-24 ENCOUNTER — Ambulatory Visit (INDEPENDENT_AMBULATORY_CARE_PROVIDER_SITE_OTHER): Payer: 59 | Admitting: Cardiology

## 2020-05-24 ENCOUNTER — Encounter: Payer: Self-pay | Admitting: Cardiology

## 2020-05-24 ENCOUNTER — Other Ambulatory Visit: Payer: Self-pay

## 2020-05-24 VITALS — BP 122/74 | HR 61 | Ht 74.0 in | Wt 225.8 lb

## 2020-05-24 DIAGNOSIS — Z7189 Other specified counseling: Secondary | ICD-10-CM

## 2020-05-24 DIAGNOSIS — R002 Palpitations: Secondary | ICD-10-CM

## 2020-05-24 MED ORDER — DILTIAZEM HCL ER COATED BEADS 240 MG PO CP24: 240.0000 mg | ORAL_CAPSULE | Freq: Every day | ORAL | 3 refills | Status: DC

## 2020-05-24 NOTE — Progress Notes (Signed)
Electrophysiology Office Follow up Visit Note:    Date:  05/24/2020   ID:  Jerry Barry, DOB June 04, 1961, MRN [id number]  PCP:  Jerry Cruel, MD  [facility] Cardiologist:  Jerry Furbish, MD  [facility] Electrophysiologist:  Jerry Epley, MD    Interval History:    Jerry Barry is a 59 y.o. male who presents for a follow up visit. They were last seen in clinic March 28, 2020. Since their last appointment, he tells me that the symptoms have persisted.  They have not gotten any worse.  He wore his monitor and had an echo.  Today, he asked a lot of questions about the COVID-19 vaccine.  We spoke about the risks versus the potential benefits of the vaccine in great detail.   Past Medical History:  Diagnosis Date  . Colon polyps   . Diverticulosis   . Esophageal stricture   . GERD (gastroesophageal reflux disease)   . Hemorrhoids   . Hiatal hernia   . Obesity (BMI 30-39.9) 06/17/2015  . OSA (obstructive sleep apnea) 10/04/2014   Mild with AHI 7.7/hr  . PVC (premature ventricular contraction)   . Sleep apnea     Past Surgical History:  Procedure Laterality Date  . arm fx     right arm , 6 th grade   . fractured hand Right     Current Medications: Current Meds  Medication Sig  . MULTIPLE VITAMIN PO Take by mouth.  Marland Kitchen omeprazole (PRILOSEC) 10 MG capsule Take 10 mg by mouth every other day.  Marland Kitchen OVER THE COUNTER MEDICATION Saw Palmetto, 1 gel cap, 160 mg. daily  . [DISCONTINUED] diltiazem (CARDIZEM CD) 120 MG 24 hr capsule TAKE 1 CAPSULE BY MOUTH EVERY DAY     Allergies:   Patient has no known allergies.   Social History   Socioeconomic History  . Marital status: Single    Spouse name: Not on file  . Number of children: 0  . Years of education: Not on file  . Highest education level: Not on file  Occupational History    Employer: [employer]  Tobacco Use  . Smoking status: Never Smoker  . Smokeless tobacco: Never Used  Vaping Use  . Vaping Use:  Never used  Substance and Sexual Activity  . Alcohol use: Yes    Comment: socially  . Drug use: No  . Sexual activity: Not on file  Other Topics Concern  . Not on file  Social History Narrative  . Not on file   Social Determinants of Health   Financial Resource Strain:   . Difficulty of Paying Living Expenses: Not on file  Food Insecurity:   . Worried About Charity fundraiser in the Last Year: Not on file  . Ran Out of Food in the Last Year: Not on file  Transportation Needs:   . Lack of Transportation (Medical): Not on file  . Lack of Transportation (Non-Medical): Not on file  Physical Activity:   . Days of Exercise per Week: Not on file  . Minutes of Exercise per Session: Not on file  Stress:   . Feeling of Stress : Not on file  Social Connections:   . Frequency of Communication with Friends and Family: Not on file  . Frequency of Social Gatherings with Friends and Family: Not on file  . Attends Religious Services: Not on file  . Active Member of Clubs or Organizations: Not on file  . Attends Archivist  Meetings: Not on file  . Marital Status: Not on file     Family History: The patient's family history includes Benign prostatic hyperplasia in his father; Colitis in his mother; Colon cancer in his maternal grandfather; Colon polyps in his brother, father, and sister; Diabetes in his father; Heart disease in his mother. There is no history of Esophageal cancer, Rectal cancer, or Stomach cancer.  ROS:   Please see the history of present illness.    All other systems reviewed and are negative.  EKGs/Labs/Other Studies Reviewed:    The following studies were reviewed today: Echo and monitor  April 14, 2020 echo personally reviewed Left ventricular function is normal Right ventricular function is normal No significant valvular abnormalities  05/10/2020 Zio personally reviewed Hrs 48-176, average 69bpm. <1% burden AF (rates 70-123bpm), longest episode  60m44s. Patient triggered events correspond to atrial fibrillation episodes, PACs, PVCs. Patient with some ventricular ectopy.    EKG:  The ekg ordered today demonstrates sinus rhythm  Recent Labs: 08/18/2019: ALT 21  Recent Lipid Panel    Component Value Date/Time   CHOL 189 08/18/2019 0858   TRIG 76 08/18/2019 0858   HDL 47 08/18/2019 0858   CHOLHDL 4.0 08/18/2019 0858   LDLCALC 128 (H) 08/18/2019 0858    Physical Exam:    VS:  BP 122/74   Pulse 61   Ht 6\' 2"  (1.88 m)   Wt 225 lb 12.8 oz (102.4 kg)   SpO2 98%   BMI 28.99 kg/m     Wt Readings from Last 3 Encounters:  05/24/20 225 lb 12.8 oz (102.4 kg)  03/28/20 220 lb 6.4 oz (100 kg)  03/08/20 221 lb (100.2 kg)     GEN:  Well nourished, well developed in no acute distress HEENT: Normal NECK: No JVD; No carotid bruits LYMPHATICS: No lymphadenopathy CARDIAC: RRR, no murmurs, rubs, gallops RESPIRATORY:  Clear to auscultation without rales, wheezing or rhonchi  ABDOMEN: Soft, non-tender, non-distended MUSCULOSKELETAL:  No edema; No deformity  SKIN: Warm and dry NEUROLOGIC:  Alert and oriented x 3 PSYCHIATRIC:  Normal affect   ASSESSMENT:    1. Palpitations   2. Educated about COVID-19 virus infection    PLAN:    In order of problems listed above:  1. Palpitations We discussed the results of his ZIO monitor and echo at length during today's visit.  I suspect [name] symptoms are related to PVCs.  I recommended we continue his diltiazem but at an increased dose of 240 mg daily.  I do not think ablation is a reasonable strategy given the very low burden of PVCs in this patient. The patient did have an episode of atrial fibrillation lasting less than 2 minutes.  I do not think this is clinically relevant at this point and would continue his current medical therapy.  2.  COVID-19 vaccine Patient asked lots of questions today regarding the COVID-19 vaccine.  Specifically, he was concerned about reports he had  heard of death or severe cardiovascular complications following after the vaccine.  I spent a significant amount of today's clinic visit educating the patient about the COVID-19 vaccine and the risks of COVID-19 infection.  He said he still deciding whether or not to get it.  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   Medication Adjustments/Labs and Tests Ordered: Current medicines are reviewed at length with the patient today.  Concerns regarding medicines are outlined above.  Orders Placed This Encounter  Procedures  . EKG 12-Lead   Meds  ordered this encounter  Medications  . diltiazem (CARDIZEM CD) 240 MG 24 hr capsule    Sig: Take 1 capsule (240 mg total) by mouth daily.    Dispense:  90 capsule    Refill:  3     Signed, Lars Mage, MD, [facility]  05/24/2020 11:24 AM    Electrophysiology [place]

## 2020-05-24 NOTE — Patient Instructions (Addendum)
Medication Instructions:  Increase your diltiazem to 240 mg   One tablet daily  *If you need a refill on your cardiac medications before your next appointment, please call your pharmacy*  Lab Work: None ordered.  If you have labs (blood work) drawn today and your tests are completely normal, you will receive your results only by: Marland Kitchen MyChart Message (if you have MyChart) OR . A paper copy in the mail If you have any lab test that is abnormal or we need to change your treatment, we will call you to review the results.  Testing/Procedures: None ordered.  Follow-Up: At [facility], you and your health needs are our priority.  As part of our continuing mission to provide you with exceptional heart care, we have created designated Provider Care Teams.  These Care Teams include your primary Cardiologist (physician) and Advanced Practice Providers (APPs -  Physician Assistants and Nurse Practitioners) who all work together to provide you with the care you need, when you need it.  We recommend signing up for the patient portal called "MyChart".  Sign up information is provided on this After Visit Summary.  MyChart is used to connect with patients for Virtual Visits (Telemedicine).  Patients are able to view lab/test results, encounter notes, upcoming appointments, etc.  Non-urgent messages can be sent to your provider as well.   To learn more about what you can do with MyChart, go to [email or web address].    Your next appointment:   Your physician wants you to follow-up in: 08/29/20 at 9:30 with Dr. Quentin Ore    Other Instructions:

## 2020-06-16 ENCOUNTER — Encounter: Payer: Self-pay | Admitting: Podiatry

## 2020-06-16 ENCOUNTER — Other Ambulatory Visit: Payer: Self-pay

## 2020-06-16 ENCOUNTER — Ambulatory Visit (INDEPENDENT_AMBULATORY_CARE_PROVIDER_SITE_OTHER): Payer: 59 | Admitting: Podiatry

## 2020-06-16 ENCOUNTER — Ambulatory Visit (INDEPENDENT_AMBULATORY_CARE_PROVIDER_SITE_OTHER): Payer: 59

## 2020-06-16 DIAGNOSIS — R2241 Localized swelling, mass and lump, right lower limb: Secondary | ICD-10-CM | POA: Diagnosis not present

## 2020-06-16 DIAGNOSIS — L923 Foreign body granuloma of the skin and subcutaneous tissue: Secondary | ICD-10-CM | POA: Diagnosis not present

## 2020-06-16 DIAGNOSIS — S91331S Puncture wound without foreign body, right foot, sequela: Secondary | ICD-10-CM

## 2020-06-16 NOTE — Progress Notes (Signed)
  Subjective:  Patient ID: Jerry Barry, male    DOB: 23-Dec-1960,  MRN: [id number]  Chief Complaint  Patient presents with  . Callouses    np - growth on toe, stepped on something in foot,     59 y.o. male presents with the above complaint. History confirmed with patient.  He was at the beach recently and thinks he might of stepped on something on the outside of the foot under the fifth toe there is a painful hard area here.  He also has a mass in his groin under the second toe which his girlfriend noticed  Objective:  Physical Exam: warm, good capillary refill, no trophic changes or ulcerative lesions, normal DP and PT pulses and normal sensory exam.  Left Foot: There is a small approximate 1 cm firm mass in the plantar aspect of the second toe which appears to be consistent with either a fibroma or a ganglion cyst.  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  Radiographs: X-ray of the left foot: No evidence of retained foreign body in the plantar aspect of the foot in the area of question, there is no evidence of bony involvement from the mass Assessment:   1. Subcutaneous mass of toe of right foot   2. Foreign body granuloma of skin and subcutaneous tissue   3. Puncture wound of plantar aspect of right foot, sequela      Plan:  Patient was evaluated and treated and all questions answered.  For the mass under the second toe he just recently noticed this and it is not terribly painful but can be uncomfortable.  I suspect this is likely either a fibroma or a ganglion cyst arising from the flexor tendon.  I would like to evaluate this with an MRI, and this was ordered.  He likely will consider surgical excision of the mass  The puncture wound in the area of question is likely a porokeratosis, this could be secondary to a previous puncture wound and scar tissue.  I do not see any evidence today on  x-rays of retained foreign body.  I debrided the lesion to comfort with a #15 blade.  The MRI will show [place] if there is any retained foreign body that is not radiopaque.  Further treatment will be decided based on this  Return in about 1 month (around 07/16/2020) for after MRI to review.

## 2020-06-22 ENCOUNTER — Other Ambulatory Visit (HOSPITAL_COMMUNITY): Payer: Self-pay | Admitting: Internal Medicine

## 2020-06-22 ENCOUNTER — Other Ambulatory Visit (HOSPITAL_COMMUNITY): Payer: Self-pay | Admitting: Family Medicine

## 2020-06-22 ENCOUNTER — Other Ambulatory Visit: Payer: Self-pay | Admitting: Family Medicine

## 2020-06-22 DIAGNOSIS — M5416 Radiculopathy, lumbar region: Secondary | ICD-10-CM

## 2020-06-25 ENCOUNTER — Other Ambulatory Visit: Payer: Self-pay

## 2020-06-25 ENCOUNTER — Ambulatory Visit (HOSPITAL_BASED_OUTPATIENT_CLINIC_OR_DEPARTMENT_OTHER)
Admission: RE | Admit: 2020-06-25 | Discharge: 2020-06-25 | Disposition: A | Discharge status: Home / Self Care | Payer: 59 | Source: Ambulatory Visit | Attending: Family Medicine | Admitting: Family Medicine

## 2020-06-25 DIAGNOSIS — M5416 Radiculopathy, lumbar region: Secondary | ICD-10-CM | POA: Diagnosis not present

## 2020-07-05 ENCOUNTER — Other Ambulatory Visit: Payer: Self-pay

## 2020-07-05 ENCOUNTER — Ambulatory Visit
Admission: RE | Admit: 2020-07-05 | Discharge: 2020-07-05 | Disposition: A | Discharge status: Home / Self Care | Payer: 59 | Source: Ambulatory Visit | Attending: Podiatry | Admitting: Podiatry

## 2020-07-05 DIAGNOSIS — L923 Foreign body granuloma of the skin and subcutaneous tissue: Secondary | ICD-10-CM

## 2020-07-05 DIAGNOSIS — R2241 Localized swelling, mass and lump, right lower limb: Secondary | ICD-10-CM

## 2020-07-05 DIAGNOSIS — S91331S Puncture wound without foreign body, right foot, sequela: Secondary | ICD-10-CM

## 2020-07-18 ENCOUNTER — Other Ambulatory Visit: Payer: Self-pay

## 2020-07-18 ENCOUNTER — Encounter: Payer: Self-pay | Admitting: Podiatry

## 2020-07-18 ENCOUNTER — Ambulatory Visit (INDEPENDENT_AMBULATORY_CARE_PROVIDER_SITE_OTHER): Payer: 59 | Admitting: Podiatry

## 2020-07-18 DIAGNOSIS — R2241 Localized swelling, mass and lump, right lower limb: Secondary | ICD-10-CM

## 2020-07-18 DIAGNOSIS — L923 Foreign body granuloma of the skin and subcutaneous tissue: Secondary | ICD-10-CM | POA: Diagnosis not present

## 2020-07-18 NOTE — Progress Notes (Signed)
  Subjective:  Patient ID: Jerry Barry, male    DOB: 1961-05-26,  MRN: [id number]  Chief Complaint  Patient presents with  . Cyst    No changes, discuss MRI results    59 y.o. male returns with the above complaint. History confirmed with patient.  Completed the MRI  Objective:  Physical Exam: warm, good capillary refill, no trophic changes or ulcerative lesions, normal DP and PT pulses and normal sensory exam.  Left Foot: There is a small approximate 1 cm firm mass in the plantar aspect of the second toe which is nonmobile and nontender.  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  Study Result  Narrative & Impression  CLINICAL DATA:  Penetrating trauma of the foot laterally 5 months ago. Plantar mass along the second toe for the last month.  EXAM: MRI OF THE RIGHT FOREFOOT WITHOUT CONTRAST  TECHNIQUE: Multiplanar, multisequence MR imaging of the right forefoot was performed. No intravenous contrast was administered.  COMPARISON:  Radiographs from 06/16/2020  FINDINGS: Bones/Joint/Cartilage  Mild degenerative findings in the head of the first metatarsal as shown on image 22 of series 8 where there is some subtle subcortical marrow edema. There is likewise some mild subcortical marrow edema in the medial sesamoid which is probably degenerative on image 25 of series 5.  Ligaments  The Lisfranc ligament appears intact.  Muscles and Tendons  Unremarkable  Soft tissues  A marker was placed over the lateral plantar site of potential puncture wound. No foreign body is identified in this vicinity on MRI.  Faintly appreciated 0.4 by 0.3 by 0.4 cm focus of reduced T1 signal in the subcutaneous tissues plantar to the flexor tendon of the second toe. This has intermediate T2 signal characteristics.  IMPRESSION: 1. Faintly appreciated 0.4 by 0.3 by 0.4 cm focus of reduced  T1 signal in the subcutaneous tissues plantar to the flexor tendon of the second toe. This tiny lesion has low T1 and intermediate T2 signal characteristics, with possibilities including a giant cell tumor of the tendon sheath, focal nodular fasciitis, a small complex ganglion cyst, small peripheral nerve sheath tumor, or a small soft tissue chondroma. 2. In the vicinity of the penetrating lesion laterally along the plantar foot, we demonstrate no foreign body by MRI. 3. Mild degenerative findings in the head of the first metatarsal.   Electronically Signed   By: Van Clines M.D.   On: 07/05/2020 13:39    Assessment:   1. Subcutaneous mass of toe of right foot   2. Foreign body granuloma of skin and subcutaneous tissue      Plan:  Patient was evaluated and treated and all questions answered.  Reviewed the MRI findings with the patient.  Discussed further treatment for both the basilar second toe and the porokeratosis.  Masses not currently bothersome.  He will continue to monitor this if it increases in size or becomes painful we will proceed with excision.  For the porokeratosis I recommended daily treatment with urea cream and a pumice stone  No follow-ups on file.

## 2020-08-29 ENCOUNTER — Ambulatory Visit (INDEPENDENT_AMBULATORY_CARE_PROVIDER_SITE_OTHER): Payer: 59 | Admitting: Cardiology

## 2020-08-29 ENCOUNTER — Encounter: Payer: Self-pay | Admitting: Cardiology

## 2020-08-29 ENCOUNTER — Other Ambulatory Visit: Payer: Self-pay

## 2020-08-29 VITALS — BP 114/76 | HR 58 | Ht 74.0 in | Wt 226.0 lb

## 2020-08-29 DIAGNOSIS — I493 Ventricular premature depolarization: Secondary | ICD-10-CM | POA: Diagnosis not present

## 2020-08-29 DIAGNOSIS — R002 Palpitations: Secondary | ICD-10-CM | POA: Diagnosis not present

## 2020-08-29 NOTE — Progress Notes (Signed)
Electrophysiology Office Follow up Visit Note:    Date:  08/29/2020   ID:  Jerry Barry, DOB 04/16/1961, MRN [id number]  PCP:  Lawerance Cruel, MD  [facility] Cardiologist:  Candee Furbish, MD  [facility] Electrophysiologist:  Vickie Epley, MD    Interval History:    Jerry Barry is a 60 y.o. male who presents for a follow up visit. They were last seen in clinic May 24, 2020 for palpitations.  At that appointment we increased the dose of his diltiazem to 240 mg daily.  Given the overall very low burden of PVCs, ablation was not felt to be an option. Since I last saw him he is doing very well on his double dose of Cardizem.  He also started taking a magnesium and potassium supplement over-the-counter.  He tells me that he rarely feels any PVCs and if he does they are less symptomatic than in the past.  No lightheadedness or dizziness on the Cardizem.    Past Medical History:  Diagnosis Date  . Colon polyps   . Diverticulosis   . Esophageal stricture   . GERD (gastroesophageal reflux disease)   . Hemorrhoids   . Hiatal hernia   . Obesity (BMI 30-39.9) 06/17/2015  . OSA (obstructive sleep apnea) 10/04/2014   Mild with AHI 7.7/hr  . PVC (premature ventricular contraction)   . Sleep apnea     Past Surgical History:  Procedure Laterality Date  . arm fx     right arm , 6 th grade   . fractured hand Right     Current Medications: Current Meds  Medication Sig  . diltiazem (CARDIZEM CD) 240 MG 24 hr capsule Take 1 capsule (240 mg total) by mouth daily.  . MULTIPLE VITAMIN PO Take by mouth.  Marland Kitchen omeprazole (PRILOSEC) 10 MG capsule Take 10 mg by mouth every other day.  Marland Kitchen OVER THE COUNTER MEDICATION Saw Palmetto, 1 gel cap, 160 mg. daily     Allergies:   Patient has no known allergies.   Social History   Socioeconomic History  . Marital status: Single    Spouse name: Not on file  . Number of children: 0  . Years of education: Not on file  . Highest education  level: Not on file  Occupational History    Employer: [employer]  Tobacco Use  . Smoking status: Never Smoker  . Smokeless tobacco: Never Used  Vaping Use  . Vaping Use: Never used  Substance and Sexual Activity  . Alcohol use: Yes    Comment: socially  . Drug use: No  . Sexual activity: Not on file  Other Topics Concern  . Not on file  Social History Narrative  . Not on file   Social Determinants of Health   Financial Resource Strain: Not on file  Food Insecurity: Not on file  Transportation Needs: Not on file  Physical Activity: Not on file  Stress: Not on file  Social Connections: Not on file     Family History: The patient's family history includes Benign prostatic hyperplasia in his father; Colitis in his mother; Colon cancer in his maternal grandfather; Colon polyps in his brother, father, and sister; Diabetes in his father; Heart disease in his mother. There is no history of Esophageal cancer, Rectal cancer, or Stomach cancer.  ROS:   Please see the history of present illness.    All other systems reviewed and are negative.  EKGs/Labs/Other Studies Reviewed:    The following  studies were reviewed today:   EKG:  The ekg ordered today demonstrates sinus rhythm.  No PVCs.  Recent Labs: No results found for requested labs within last 8760 hours.  Recent Lipid Panel    Component Value Date/Time   CHOL 189 08/18/2019 0858   TRIG 76 08/18/2019 0858   HDL 47 08/18/2019 0858   CHOLHDL 4.0 08/18/2019 0858   LDLCALC 128 (H) 08/18/2019 0858    Physical Exam:    VS:  BP 114/76   Pulse (!) 58   Ht 6\' 2"  (1.88 m)   Wt 226 lb (102.5 kg)   SpO2 98%   BMI 29.02 kg/m     Wt Readings from Last 3 Encounters:  08/29/20 226 lb (102.5 kg)  05/24/20 225 lb 12.8 oz (102.4 kg)  03/28/20 220 lb 6.4 oz (100 kg)     GEN:  Well nourished, well developed in no acute distress HEENT: Normal NECK: No JVD; No carotid bruits LYMPHATICS: No lymphadenopathy CARDIAC: RRR, no  murmurs, rubs, gallops RESPIRATORY:  Clear to auscultation without rales, wheezing or rhonchi  ABDOMEN: Soft, non-tender, non-distended MUSCULOSKELETAL:  No edema; No deformity  SKIN: Warm and dry NEUROLOGIC:  Alert and oriented x 3 PSYCHIATRIC:  Normal affect   ASSESSMENT:    1. PVC (premature ventricular contraction)   2. Palpitations    PLAN:    In order of problems listed above:  1. PVCs Symptomatic.  Significantly improved on the double dose of Cardizem.  Overall very low burden.  Currently taking diltiazem 240 mg by mouth daily.  He is also taking magnesium and potassium over-the-counter supplementation. For now, recommend continuing his Cardizem and magnesium/potassium supplementation.  I will plan to see him back in 1 year or sooner as needed.  Medication Adjustments/Labs and Tests Ordered: Current medicines are reviewed at length with the patient today.  Concerns regarding medicines are outlined above.  Orders Placed This Encounter  Procedures  . EKG 12-Lead   No orders of the defined types were placed in this encounter.    Signed, Lars Mage, MD, [facility]  08/29/2020 9:44 AM    Electrophysiology [place]

## 2020-08-29 NOTE — Patient Instructions (Signed)
Medication Instructions:  Your physician recommends that you continue on your current medications as directed. Please refer to the Current Medication list given to you today.  *If you need a refill on your cardiac medications before your next appointment, please call your pharmacy*   Lab Work: None ordered.  If you have labs (blood work) drawn today and your tests are completely normal, you will receive your results only by: MyChart Message (if you have MyChart) OR A paper copy in the mail If you have any lab test that is abnormal or we need to change your treatment, we will call you to review the results.   Testing/Procedures: None ordered.    Follow-Up: At [facility], you and your health needs are our priority.  As part of our continuing mission to provide you with exceptional heart care, we have created designated Provider Care Teams.  These Care Teams include your primary Cardiologist (physician) and Advanced Practice Providers (APPs -  Physician Assistants and Nurse Practitioners) who all work together to provide you with the care you need, when you need it.  We recommend signing up for the patient portal called "MyChart".  Sign up information is provided on this After Visit Summary.  MyChart is used to connect with patients for Virtual Visits (Telemedicine).  Patients are able to view lab/test results, encounter notes, upcoming appointments, etc.  Non-urgent messages can be sent to your provider as well.   To learn more about what you can do with MyChart, go to [email or web address].    Your next appointment:   12 month(s)  The format for your next appointment:   In Person  Provider:   Cameron Lambert, MD    

## 2021-01-21 IMAGING — MR MR LUMBAR SPINE W/O CM
4 of 5 series · 25 of 48 positions shown · non-contrast
Comparison: MRI 12/26/2011

CLINICAL DATA: Chronic low back pain with left-sided radiculopathy

EXAM:
MRI LUMBAR SPINE WITHOUT CONTRAST
TECHNIQUE: Multiplanar, multisequence MR imaging of the lumbar spine was
performed. No intravenous contrast was administered.

[Series 2: T1 · sagittal · 4.0mm · 0.81mm/px · 6 of 16 slices shown (1 of 2)]
[im 1/16]
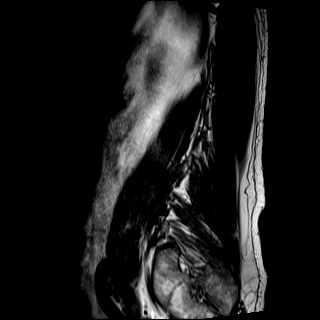
[im 4/16]
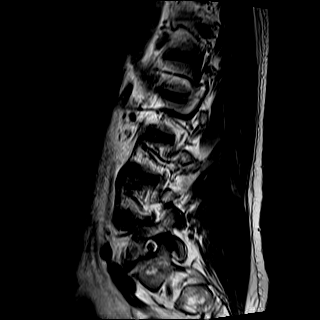
[im 7/16]
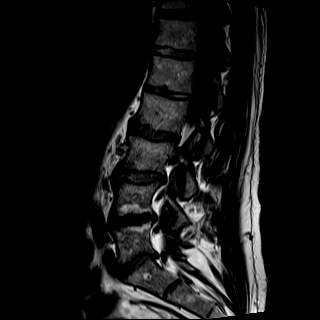
[im 10/16]
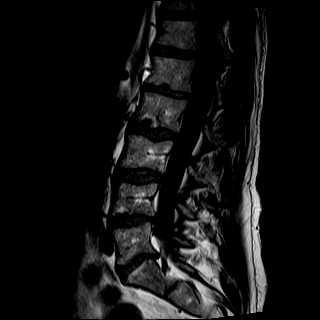
[im 13/16]
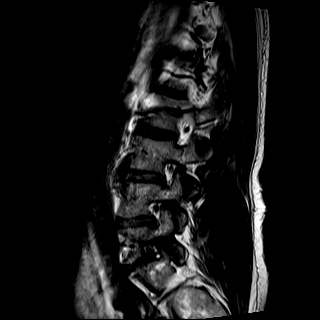
[im 16/16]
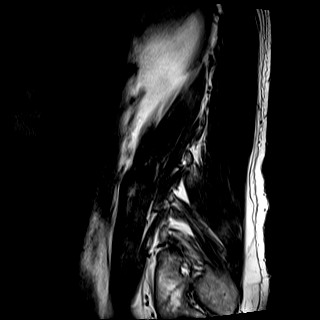

[Series 3: T2 · sagittal · 4.0mm · 0.81mm/px · 6 of 16 slices shown (1 of 2)]
[im 1/16]
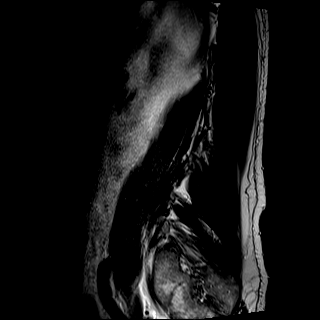
[im 4/16]
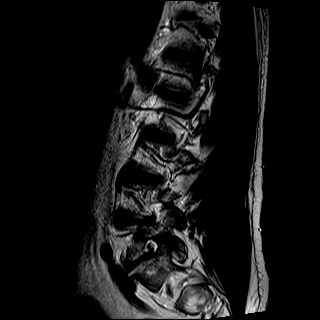
[im 7/16]
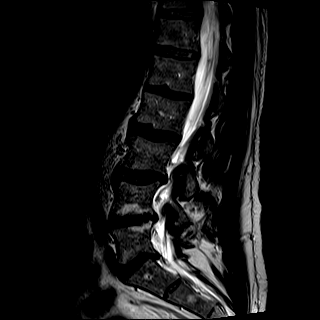
[im 10/16]
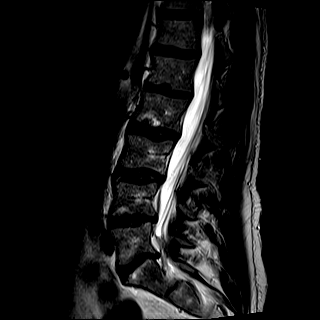
[im 13/16]
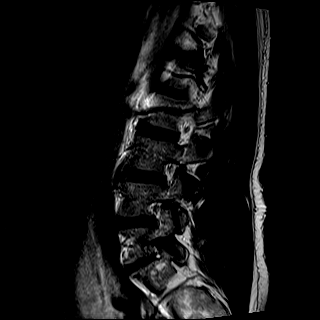
[im 16/16]
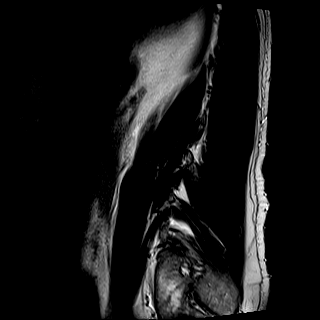

[Series 5: T2 · axial · 4.0mm · 0.39mm/px · z∈[-62,+143]mm · 9 of 42 slices shown (2 of 2)]
[im 1/42]
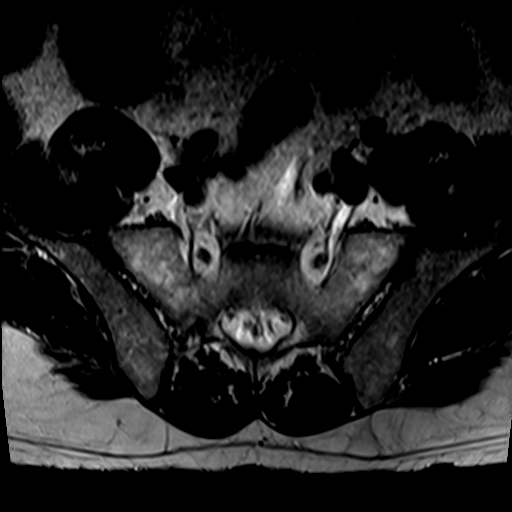
[im 6/42]
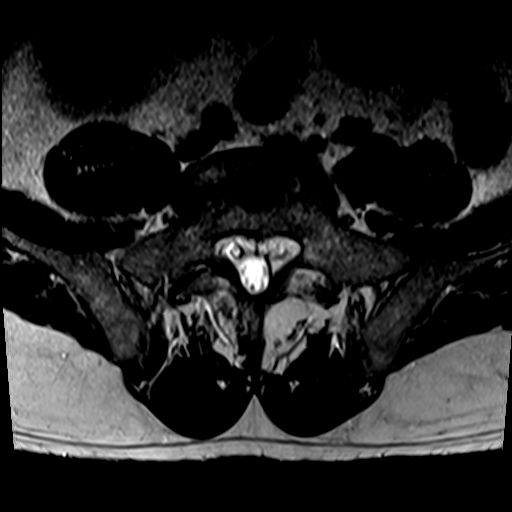
[im 12/42]
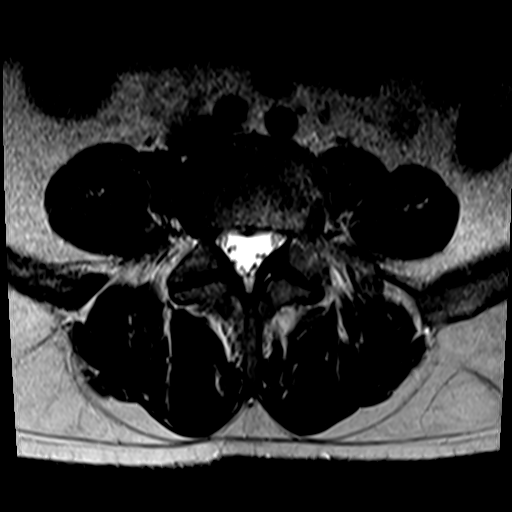
[im 18/42]
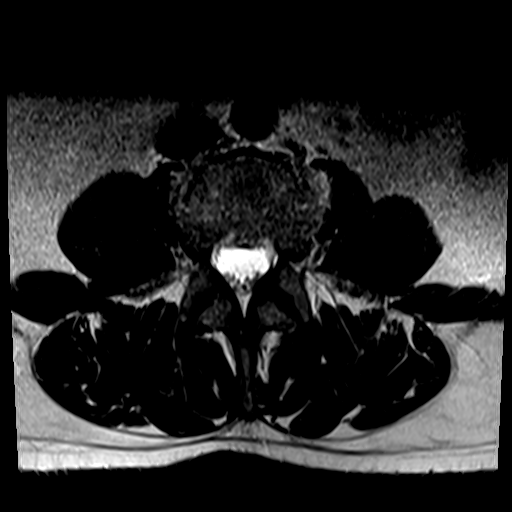
[im 21/42]
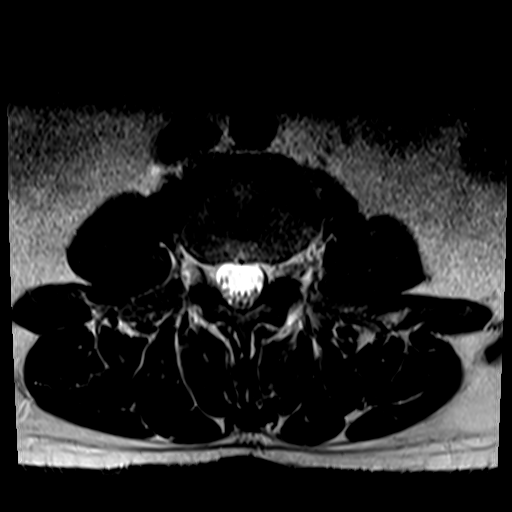
[im 24/42]
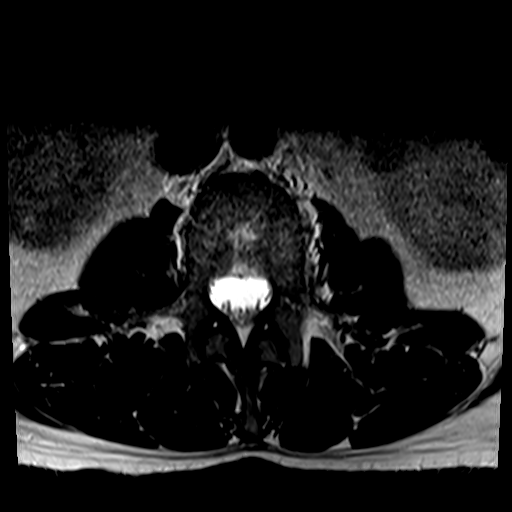
[im 30/42]
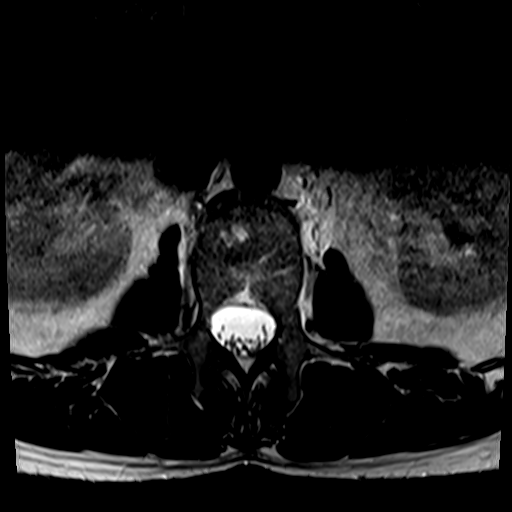
[im 36/42]
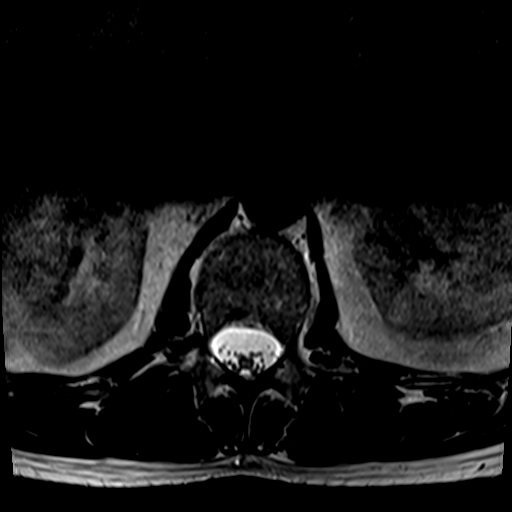
[im 42/42]
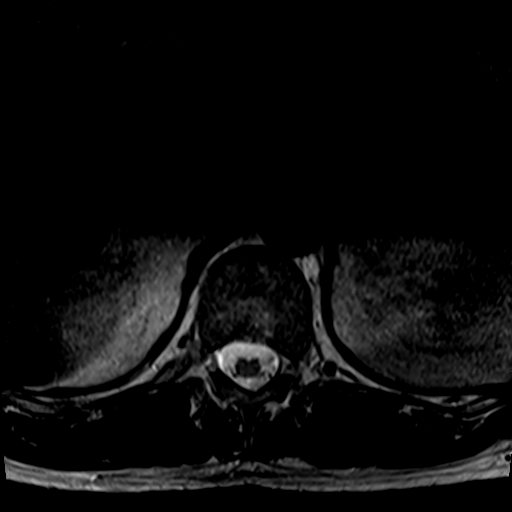

[Series 6: T1 · axial · 4.0mm · 0.39mm/px · z∈[-62,+113]mm · 4 of 42 slices shown (2 of 2)]
[im 1/42]
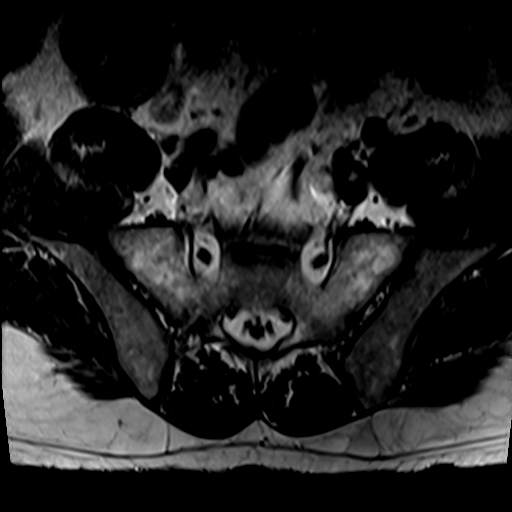
[im 6/42]
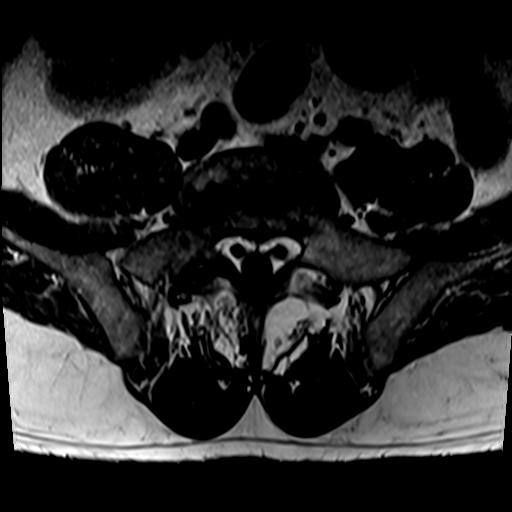
[im 21/42]
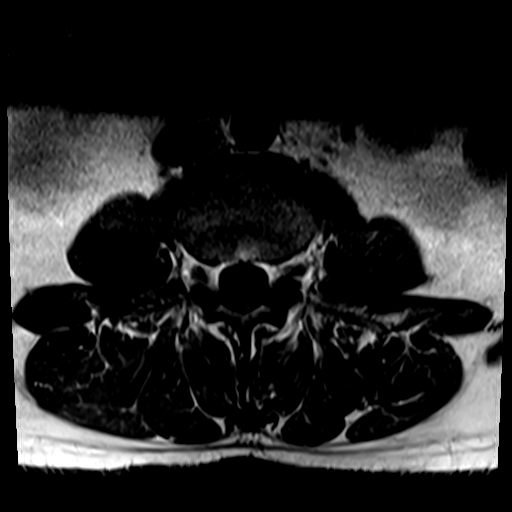
[im 36/42]
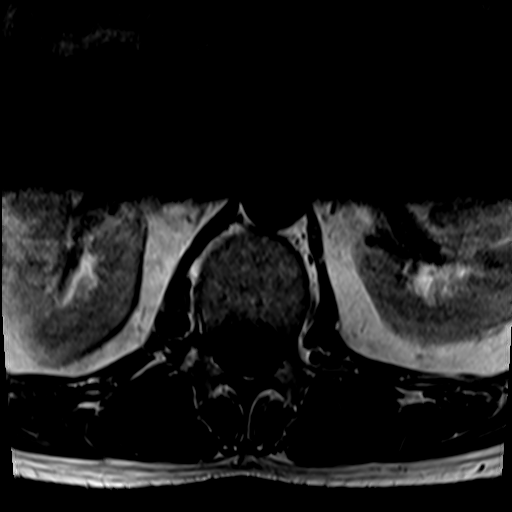

[25 of 48 positions shown; findings below may reference images not displayed]

FINDINGS: Segmentation: Presumed standard anatomy with the inferior-most well
developed disc space designated as L5-S1.

Alignment:  No significant listhesis.

Vertebrae: No fracture, evidence of discitis, or bone lesion.
Scattered discogenic endplate marrow changes including small
Schmorl's nodes.

Conus medullaris and cauda equina: Conus extends to the L1-2 level.
Conus and cauda equina appear normal.

Paraspinal and other soft tissues: Negative.

Disc levels:

T12-L1: No significant disc protrusion, foraminal stenosis, or canal
stenosis. Unchanged.

L1-L2: No significant disc protrusion, foraminal stenosis, or canal
stenosis. Unchanged.

L2-L3: No significant disc protrusion, foraminal stenosis, or canal
stenosis. Unchanged.

L3-L4: Minimal diffuse disc bulge with mild bilateral facet
arthropathy. Findings result in mild left foraminal stenosis without
canal stenosis. Slight interval progression from prior.

L4-L5: Minimal diffuse disc bulge with mild facet hypertrophy.
Borderline-mild bilateral foraminal stenosis without evidence of
neural impingement. No canal stenosis. Unchanged.

L5-S1: Endplate osteophytic ridging and mild disc bulge. Right
greater than left bilateral facet arthropathy. Moderate right and
mild left foraminal stenosis without canal stenosis. Unchanged.
IMPRESSION: 1. Overall mild multilevel lumbar spondylosis with only minimal
interval progression from 12/26/2011.
2. Slight interval progression of mild left foraminal stenosis at
L3-L4.
3. Moderate right and mild left foraminal stenosis at L5-S1.
4. No significant canal stenosis at any lumbar level.

## 2021-05-16 ENCOUNTER — Other Ambulatory Visit: Payer: Self-pay | Admitting: Cardiology

## 2021-08-04 ENCOUNTER — Telehealth: Payer: Self-pay | Admitting: Cardiology

## 2021-08-04 DIAGNOSIS — G4733 Obstructive sleep apnea (adult) (pediatric): Secondary | ICD-10-CM

## 2021-08-04 NOTE — Telephone Encounter (Signed)
Spoke with the patient who states that he needs a new CPAP machine. He states that it is no longer compatible with his phone due to an upgrade so he is not able to get updates through the app anymore. He states that he has also woken up at night several times and felt that his machine wasn't working properly. He took his machine to his vendor and they advised that if he has had his machine for over 5 years that he should get a new machine. He would like to get orders for a new machine.

## 2021-08-04 NOTE — Telephone Encounter (Signed)
Patient is calling in to get a new monitor. And was told that her office have to place the order for him. He states that he is starting having issues with it. Please advise

## 2021-08-08 NOTE — Telephone Encounter (Signed)
Order placed to [employer] [facility] via community message. 

## 2021-08-08 NOTE — Telephone Encounter (Signed)
Yes, patient was set up 03/13/2015

## 2021-08-11 NOTE — Telephone Encounter (Signed)
Advised patient order has been placed.

## 2021-08-12 ENCOUNTER — Other Ambulatory Visit: Payer: Self-pay | Admitting: Cardiology

## 2021-09-08 ENCOUNTER — Telehealth: Payer: Self-pay | Admitting: Cardiology

## 2021-09-08 DIAGNOSIS — G4733 Obstructive sleep apnea (adult) (pediatric): Secondary | ICD-10-CM

## 2021-09-08 NOTE — Telephone Encounter (Signed)
What problem are you experiencing? Repair man advised he needs a new machine advised him to have order faxed to [phone]  Who is your medical equipment company? [employer]   Please route to the sleep study assistant.

## 2021-09-21 NOTE — Addendum Note (Signed)
Addended by: Freada Bergeron on: 09/21/2021 04:37 PM   Modules accepted: Orders

## 2021-10-04 ENCOUNTER — Telehealth: Payer: Self-pay | Admitting: Cardiology

## 2021-10-04 MED ORDER — DILTIAZEM HCL ER COATED BEADS 240 MG PO CP24: 240.0000 mg | ORAL_CAPSULE | Freq: Every day | ORAL | 0 refills | Status: DC

## 2021-10-04 NOTE — Telephone Encounter (Signed)
Pt's medication was sent to pt's pharmacy as requested. Confirmation received.  °

## 2021-10-04 NOTE — Telephone Encounter (Signed)
?*  STAT* If patient is at the pharmacy, call can be transferred to refill team. ? ? ?1. Which medications need to be refilled? (please list name of each medication and dose if known) diltiazem (CARDIZEM CD) 240 MG 24 hr capsule ? ?2. Which pharmacy/location (including street and city if local pharmacy) is medication to be sent to? [id number] [place], [place] - [address]? ?3. Do they need a 30 day or 90 day supply? 30 day ? ? ?Patient has an appointment 5/4. He is completely out of medication.  ?

## 2021-10-05 NOTE — Telephone Encounter (Signed)
Documentation needed: Use and benefit notes, Earma Reading checked [facility] for me and Use and Benefit notes were not in there. ? ?Thank you, ? ?Jannet Askew ? ?Pap Intake Specialist ? ?PT: Jerry Barry ?DOB: 03-27-1961 ?We're working on this patient's replacement bipap order, but we don't see any notes within the last year that talk about him using and benefiting from his current pap therapy.  I don't see any pending appts with Dr. Radford Pax either.  Would you be able to get him in for a visit? ?Thanks, ?Melissa ? ? ?

## 2021-10-05 NOTE — Telephone Encounter (Signed)
Jerry Barry will call and offer the patient an appointment for 10/06/21 to be eligible to get his  replacement bipap order. ?

## 2021-10-06 ENCOUNTER — Encounter: Payer: Self-pay | Admitting: Cardiology

## 2021-10-06 ENCOUNTER — Other Ambulatory Visit: Payer: Self-pay

## 2021-10-06 ENCOUNTER — Telehealth: Payer: Self-pay | Admitting: *Deleted

## 2021-10-06 ENCOUNTER — Ambulatory Visit: Payer: 59 | Admitting: Cardiology

## 2021-10-06 VITALS — BP 131/70 | HR 63 | Ht 74.0 in | Wt 228.2 lb

## 2021-10-06 DIAGNOSIS — G4733 Obstructive sleep apnea (adult) (pediatric): Secondary | ICD-10-CM

## 2021-10-06 DIAGNOSIS — I493 Ventricular premature depolarization: Secondary | ICD-10-CM

## 2021-10-06 NOTE — Telephone Encounter (Signed)
This encounter was created in error - please disregard.

## 2021-10-06 NOTE — Telephone Encounter (Signed)
-----   Message from Antonieta Iba, RN sent at 10/06/2021 10:18 AM EST ----- ?I am not sure if you need to reorder his new device after he saw Dr. Radford Pax today. She would like for him to get a new ResMed CPAP at 11cm H2O with heated humidity and mask of choice. Follow up 6 weeks after receiving device.  ?Thank you! ? ?

## 2021-10-06 NOTE — Addendum Note (Signed)
Addended by: Freada Bergeron on: 10/06/2021 02:20 PM ? ? Modules accepted: Orders, Level of Service ? ?

## 2021-10-06 NOTE — Telephone Encounter (Signed)
Patient calling back to say he has a cpap machine. Please advise  ?

## 2021-10-06 NOTE — Patient Instructions (Signed)
Medication Instructions:  ?Your physician recommends that you continue on your current medications as directed. Please refer to the Current Medication list given to you today. ? ?*If you need a refill on your cardiac medications before your next appointment, please call your pharmacy* ? ?Follow-Up: ?At [facility], you and your health needs are our priority.  As part of our continuing mission to provide you with exceptional heart care, we have created designated Provider Care Teams.  These Care Teams include your primary Cardiologist (physician) and Advanced Practice Providers (APPs -  Physician Assistants and Nurse Practitioners) who all work together to provide you with the care you need, when you need it. ? ?Follow up 6 weeks after receiving your new device.  ? ? ?

## 2021-10-06 NOTE — Progress Notes (Signed)
?Date:  10/06/2021  ? ?ID:  [name], DOB [birth year], MRN [id number] ? ?PCP:  Lawerance Cruel, MD  ?Cardiologist:  Candee Furbish, MD  ?Sleep Medicine:  Fransico Him, MD ?Electrophysiologist:  Vickie Epley, MD  ? ?Chief Complaint:  OSA ? ?History of Present Illness:   ? ?Jerry Barry is a 61 y.o. male who presents for followup of obstructive sleep apnea.  He has mild OSA with an AHI of 7.7 events per hour on PSG with mild intermittent snoring and no significant oxygen desaturations. He is on CPAP at 11cm H2O. ? ?He is doing well with his CPAP device and thinks that he has gotten used to it.  He tolerates the mask and feels the pressure is adequate for the most part.  Since going on CPAP he feels rested in the am and has no significant daytime sleepiness.  He denies any significant mouth or nasal dryness or nasal congestion.  He does not think that he snores.   His device is > 62 years old and does not have a 5G device so cannot get data on his phone by his device.  ? ?Prior CV studies:   ?The following studies were reviewed today: ? ?PAP compliance download ? ?Past Medical History:  ?Diagnosis Date  ? Colon polyps   ? Diverticulosis   ? Esophageal stricture   ? GERD (gastroesophageal reflux disease)   ? Hemorrhoids   ? Hiatal hernia   ? Obesity (BMI 30-39.9) 06/17/2015  ? OSA (obstructive sleep apnea) 10/04/2014  ? Mild with AHI 7.7/hr  ? PVC (premature ventricular contraction)   ? Sleep apnea   ? ?Past Surgical History:  ?Procedure Laterality Date  ? arm fx    ? right arm , 6 th grade   ? fractured hand Right   ?  ? ?Current Meds  ?Medication Sig  ? diltiazem (CARDIZEM CD) 240 MG 24 hr capsule Take 1 capsule (240 mg total) by mouth daily. Please make overdue appt with Dr. Quentin Ore before anymore refills. Thank you 3rd and Final attempt  ? MULTIPLE VITAMIN PO Take by mouth.  ? omeprazole (PRILOSEC) 10 MG capsule Take 10 mg by mouth every other day.  ? OVER THE COUNTER MEDICATION Saw Palmetto, 1 gel cap, 160  mg. daily  ?  ? ?Allergies:   Patient has no known allergies.  ? ?Social History  ? ?Tobacco Use  ? Smoking status: Never  ? Smokeless tobacco: Never  ?Vaping Use  ? Vaping Use: Never used  ?Substance Use Topics  ? Alcohol use: Yes  ?  Comment: socially  ? Drug use: No  ?  ? ?Family Hx: ?The patient's family history includes Benign prostatic hyperplasia in his father; Colitis in his mother; Colon cancer in his maternal grandfather; Colon polyps in his brother, father, and sister; Diabetes in his father; Heart disease in his mother. There is no history of Esophageal cancer, Rectal cancer, or Stomach cancer. ? ?ROS:   ?Please see the history of present illness.    ? ?All other systems reviewed and are negative. ? ? ?Labs/Other Tests and Data Reviewed:   ? ?Recent Labs: ?No results found for requested labs within last 8760 hours.  ? ?Recent Lipid Panel ?Lab Results  ?Component Value Date/Time  ? CHOL 189 08/18/2019 08:58 AM  ? TRIG 76 08/18/2019 08:58 AM  ? HDL 47 08/18/2019 08:58 AM  ? CHOLHDL 4.0 08/18/2019 08:58 AM  ? LDLCALC 128 (H) 08/18/2019 08:58 AM  ? ? ?  Wt Readings from Last 3 Encounters:  ?10/06/21 228 lb 3.2 oz (103.5 kg)  ?08/29/20 226 lb (102.5 kg)  ?05/24/20 225 lb 12.8 oz (102.4 kg)  ?  ? ?Objective:   ? ?Vital Signs:  BP 131/70   Pulse 63   Ht '6\' 2"'$  (1.88 m)   Wt 228 lb 3.2 oz (103.5 kg)   SpO2 98%   BMI 29.30 kg/m?   ?GEN: Well nourished, well developed in no acute distress ?HEENT: Normal ?NECK: No JVD; No carotid bruits ?LYMPHATICS: No lymphadenopathy ?CARDIAC:RRR, no murmurs, rubs, gallops ?RESPIRATORY:  Clear to auscultation without rales, wheezing or rhonchi  ?ABDOMEN: Soft, non-tender, non-distended ?MUSCULOSKELETAL:  No edema; No deformity  ?SKIN: Warm and dry ?NEUROLOGIC:  Alert and oriented x 3 ?PSYCHIATRIC:  Normal affect   ?ASSESSMENT & PLAN:   ? ?1.  OSA - The patient is tolerating PAP therapy well without any problems. The PAP download performed by his DME was personally reviewed and  interpreted by me today and showed an AHI of 0.8/hr on 11 cm H2O with 97% compliance in using more than 4 hours nightly.  The patient has been using and benefiting from PAP use and will continue to benefit from therapy.  ?-I will order him a new ResMed CPAP at 11cm H2O with heated humidity and mask of choice ?-he will see me back 6 weeks after he gets his new device per insurance requirements ? ?2.  PVCs ?-continue prescription drug management with Cardizem CD 240 mg daily with as needed refills ?  ?Medication Adjustments/Labs and Tests Ordered: ?Current medicines are reviewed at length with the patient today.  Concerns regarding medicines are outlined above.  ?Tests Ordered: ?No orders of the defined types were placed in this encounter. ? ?Medication Changes: ?No orders of the defined types were placed in this encounter. ? ? ?Disposition:  Follow up in 1 year(s) ? ?Signed, ?Fransico Him, MD  ?10/06/2021 10:14 AM    ?[place] ?

## 2021-10-06 NOTE — Telephone Encounter (Signed)
I am not sure if you need to reorder his new device after he saw Dr. Radford Pax today. She would like for him to get a new ResMed CPAP at 11cm H2O with heated humidity and mask of choice. Follow up 6 weeks after receiving device.  ? ?Already ordered ?

## 2021-10-18 NOTE — Telephone Encounter (Signed)
Returned patients call left message to call the schedulers for a sleep compliance appointment at [phone]. ?

## 2021-11-07 ENCOUNTER — Other Ambulatory Visit: Payer: Self-pay

## 2021-11-07 MED ORDER — DILTIAZEM HCL ER COATED BEADS 240 MG PO CP24: 240.0000 mg | ORAL_CAPSULE | Freq: Every day | ORAL | 0 refills | Status: DC

## 2021-11-30 ENCOUNTER — Encounter: Payer: Self-pay | Admitting: Cardiology

## 2021-11-30 ENCOUNTER — Ambulatory Visit: Payer: 59 | Admitting: Cardiology

## 2021-11-30 VITALS — BP 110/70 | HR 62 | Ht 74.0 in | Wt 225.0 lb

## 2021-11-30 DIAGNOSIS — I493 Ventricular premature depolarization: Secondary | ICD-10-CM

## 2021-11-30 DIAGNOSIS — Z8249 Family history of ischemic heart disease and other diseases of the circulatory system: Secondary | ICD-10-CM

## 2021-11-30 DIAGNOSIS — R002 Palpitations: Secondary | ICD-10-CM

## 2021-11-30 MED ORDER — DILTIAZEM HCL ER COATED BEADS 120 MG PO CP24: 120.0000 mg | ORAL_CAPSULE | Freq: Every day | ORAL | 3 refills | Status: DC

## 2021-11-30 NOTE — Assessment & Plan Note (Signed)
Dr. Radford Pax.  He states he recently got a new CPAP machine. ?

## 2021-11-30 NOTE — Assessment & Plan Note (Signed)
Currently doing well.  Feeling less palpitations with magnesium chlorinate supplement.  He would like to try to decrease his diltiazem dosage from 240 down to 120 CD.  This sounds fine.  If he starts to feel more issues, we can always increase back to 240. ?

## 2021-11-30 NOTE — Assessment & Plan Note (Signed)
Mother had coronary artery disease.  In 2015 his coronary calcium score was 0.  We will go ahead and repeat.  $99 test.  If plaque is present may need to consider Crestor ?

## 2021-11-30 NOTE — Patient Instructions (Signed)
Medication Instructions:  ?Please decrease your Cardizem to 120 mg daily. ?Continue all other medications as listed. ? ?*If you need a refill on your cardiac medications before your next appointment, please call your pharmacy* ? ?Testing/Procedures: ?Your physician has requested that you have a Coronary Calcium score which is completed by CT. Cardiac computed tomography (CT) is a painless test that uses an x-ray machine to take clear, detailed pictures of your heart. There are no instructions for this testing.  You may eat/drink and take your normal medications this day.  The cost of the testing is $99 due at the time of your appointment. ? ?Follow-Up: ?At [facility], you and your health needs are our priority.  As part of our continuing mission to provide you with exceptional heart care, we have created designated Provider Care Teams.  These Care Teams include your primary Cardiologist (physician) and Advanced Practice Providers (APPs -  Physician Assistants and Nurse Practitioners) who all work together to provide you with the care you need, when you need it. ? ?We recommend signing up for the patient portal called "MyChart".  Sign up information is provided on this After Visit Summary.  MyChart is used to connect with patients for Virtual Visits (Telemedicine).  Patients are able to view lab/test results, encounter notes, upcoming appointments, etc.  Non-urgent messages can be sent to your provider as well.   ?To learn more about what you can do with MyChart, go to [email or web address].   ? ?Your next appointment:   ?1 year(s) ? ?The format for your next appointment:   ?In Person ? ?Provider:   ?Candee Furbish, MD   ? ? ?Thank you for choosing [facility]!! ? ? ? ? ?Important Information About Sugar ? ? ? ? ?  ?

## 2021-11-30 NOTE — Progress Notes (Signed)
?Cardiology Office Note:   ? ?Date:  11/30/2021  ? ?ID:  Jerry Barry, DOB 07-30-1961, MRN [id number] ? ?PCP:  Lawerance Cruel, MD ?  ?[facility] Providers ?Cardiologist:  Candee Furbish, MD ?Electrophysiologist:  Vickie Epley, MD  ?Sleep Medicine:  Fransico Him, MD    ? ?Referring MD: Lawerance Cruel, MD  ? ? ?History of Present Illness:   ? ?Jerry Barry is a 61 y.o. male here for follow-up paroxysmal atrial fibrillation.  Episode of rapid heart rate lasted about 30 minutes originally heart rate was about 150 beats a minute after a hot day sweating in [place].  Another episode occurred years ago while watching a movie.  Sleep apnea is being treated by Dr. Radford Pax.  He just got a new machine.  PVCs have been helped with magnesium supplement. ?Takes MagClorinate and Potassium. Hardly notes a skip.  ? ?Past Medical History:  ?Diagnosis Date  ? Colon polyps   ? Diverticulosis   ? Esophageal stricture   ? GERD (gastroesophageal reflux disease)   ? Hemorrhoids   ? Hiatal hernia   ? Obesity (BMI 30-39.9) 06/17/2015  ? OSA (obstructive sleep apnea) 10/04/2014  ? Mild with AHI 7.7/hr  ? PVC (premature ventricular contraction)   ? Sleep apnea   ? ? ?Past Surgical History:  ?Procedure Laterality Date  ? arm fx    ? right arm , 6 th grade   ? fractured hand Right   ? ? ?Current Medications: ?Current Meds  ?Medication Sig  ? diltiazem (CARDIZEM CD) 120 MG 24 hr capsule Take 1 capsule (120 mg total) by mouth daily.  ? MULTIPLE VITAMIN PO Take by mouth.  ? omeprazole (PRILOSEC) 10 MG capsule Take 10 mg by mouth every other day.  ? OVER THE COUNTER MEDICATION Saw Palmetto, 1 gel cap, 160 mg. daily  ? [DISCONTINUED] diltiazem (CARDIZEM CD) 240 MG 24 hr capsule Take 1 capsule (240 mg total) by mouth daily. Please keep upcoming appt. With Dr. Candee Furbish in May in order to receive further refills. Thank You.  ?  ? ?Allergies:   Patient has no known allergies.  ? ?Social History  ? ?Socioeconomic History  ? Marital  status: Single  ?  Spouse name: Not on file  ? Number of children: 0  ? Years of education: Not on file  ? Highest education level: Not on file  ?Occupational History  ?  Employer: [employer]  ?Tobacco Use  ? Smoking status: Never  ? Smokeless tobacco: Never  ?Vaping Use  ? Vaping Use: Never used  ?Substance and Sexual Activity  ? Alcohol use: Yes  ?  Comment: socially  ? Drug use: No  ? Sexual activity: Not on file  ?Other Topics Concern  ? Not on file  ?Social History Narrative  ? Not on file  ? ?Social Determinants of Health  ? ?Financial Resource Strain: Not on file  ?Food Insecurity: Not on file  ?Transportation Needs: Not on file  ?Physical Activity: Not on file  ?Stress: Not on file  ?Social Connections: Not on file  ?  ? ?Family History: ?The patient's family history includes Benign prostatic hyperplasia in his father; Colitis in his mother; Colon cancer in his maternal grandfather; Colon polyps in his brother, father, and sister; Diabetes in his father; Heart disease in his mother. There is no history of Esophageal cancer, Rectal cancer, or Stomach cancer. ? ?ROS:   ?Please see the history of present illness.    ?  All other systems reviewed and are negative. ? ?EKGs/Labs/Other Studies Reviewed:   ? ?The following studies were reviewed today: ?Coronary calcium score 20 15-0 ? ?Echocardiogram 2015-EF 70% mildly dilated left atrium with mild tricuspid regurgitation ? ? ?EKG:  EKG is  ordered today.  The ekg ordered today demonstrates sinus rhythm 62 no other abnormalities ? ?Recent Labs: ?No results found for requested labs within last 8760 hours.  ?Recent Lipid Panel ?   ?Component Value Date/Time  ? CHOL 189 08/18/2019 0858  ? TRIG 76 08/18/2019 0858  ? HDL 47 08/18/2019 0858  ? CHOLHDL 4.0 08/18/2019 0858  ? Guin 128 (H) 08/18/2019 4580  ? ? ? ?Risk Assessment/Calculations:   ? ?CHA2DS2-VASc Score = 0  ? This indicates a 0.2% annual risk of stroke. ?The patient's score is based upon: ?CHF History:  0 ?HTN History: 0 ?Diabetes History: 0 ?Stroke History: 0 ?Vascular Disease History: 0 ?Age Score: 0 ?Gender Score: 0 ?  ?  ? ?    ? ?   ? ?Physical Exam:   ? ?VS:  BP 110/70   Pulse 62   Ht '6\' 2"'$  (1.88 m)   Wt 225 lb (102.1 kg)   BMI 28.89 kg/m?    ? ?Wt Readings from Last 3 Encounters:  ?11/30/21 225 lb (102.1 kg)  ?10/06/21 228 lb 3.2 oz (103.5 kg)  ?08/29/20 226 lb (102.5 kg)  ?  ? ?GEN:  Well nourished, well developed in no acute distress ?HEENT: Normal ?NECK: No JVD; No carotid bruits ?LYMPHATICS: No lymphadenopathy ?CARDIAC: RRR, no murmurs, no rubs, gallops ?RESPIRATORY:  Clear to auscultation without rales, wheezing or rhonchi  ?ABDOMEN: Soft, non-tender, non-distended ?MUSCULOSKELETAL:  No edema; No deformity  ?SKIN: Warm and dry ?NEUROLOGIC:  Alert and oriented x 3 ?PSYCHIATRIC:  Normal affect  ? ?ASSESSMENT:   ? ?1. Palpitations   ?2. PVC (premature ventricular contraction)   ?3. Family history of cardiovascular disease   ?4. Family history of early CAD   ? ?PLAN:   ? ?In order of problems listed above: ? ?PAF (paroxysmal atrial fibrillation) ?Currently doing well.  Feeling less palpitations with magnesium chlorinate supplement.  He would like to try to decrease his diltiazem dosage from 240 down to 120 CD.  This sounds fine.  If he starts to feel more issues, we can always increase back to 240. ? ?OSA (obstructive sleep apnea) ?Dr. Radford Pax.  He states he recently got a new CPAP machine. ? ?Family history of early CAD ?Mother had coronary artery disease.  In 2015 his coronary calcium score was 0.  We will go ahead and repeat.  $99 test.  If plaque is present may need to consider Crestor ?  ? ? ?   ? ? ?Medication Adjustments/Labs and Tests Ordered: ?Current medicines are reviewed at length with the patient today.  Concerns regarding medicines are outlined above.  ?Orders Placed This Encounter  ?Procedures  ? CT CARDIAC SCORING (SELF PAY ONLY)  ? EKG 12-Lead  ? ?Meds ordered this encounter   ?Medications  ? diltiazem (CARDIZEM CD) 120 MG 24 hr capsule  ?  Sig: Take 1 capsule (120 mg total) by mouth daily.  ?  Dispense:  90 capsule  ?  Refill:  3  ? ? ?Patient Instructions  ?Medication Instructions:  ?Please decrease your Cardizem to 120 mg daily. ?Continue all other medications as listed. ? ?*If you need a refill on your cardiac medications before your next appointment, please call your pharmacy* ? ?Testing/Procedures: ?  Your physician has requested that you have a Coronary Calcium score which is completed by CT. Cardiac computed tomography (CT) is a painless test that uses an x-ray machine to take clear, detailed pictures of your heart. There are no instructions for this testing.  You may eat/drink and take your normal medications this day.  The cost of the testing is $99 due at the time of your appointment. ? ?Follow-Up: ?At [facility], you and your health needs are our priority.  As part of our continuing mission to provide you with exceptional heart care, we have created designated Provider Care Teams.  These Care Teams include your primary Cardiologist (physician) and Advanced Practice Providers (APPs -  Physician Assistants and Nurse Practitioners) who all work together to provide you with the care you need, when you need it. ? ?We recommend signing up for the patient portal called "MyChart".  Sign up information is provided on this After Visit Summary.  MyChart is used to connect with patients for Virtual Visits (Telemedicine).  Patients are able to view lab/test results, encounter notes, upcoming appointments, etc.  Non-urgent messages can be sent to your provider as well.   ?To learn more about what you can do with MyChart, go to [email or web address].   ? ?Your next appointment:   ?1 year(s) ? ?The format for your next appointment:   ?In Person ? ?Provider:   ?Candee Furbish, MD   ? ? ?Thank you for choosing [facility]!! ? ? ? ? ?Important Information About Sugar ? ? ? ? ?    ? ?Signed, ?Candee Furbish, MD  ?11/30/2021 4:34 PM    ?[place] ?

## 2021-12-08 ENCOUNTER — Encounter: Payer: 59 | Admitting: Cardiology

## 2021-12-08 VITALS — Ht 74.0 in | Wt 222.0 lb

## 2021-12-08 DIAGNOSIS — G4733 Obstructive sleep apnea (adult) (pediatric): Secondary | ICD-10-CM

## 2021-12-08 DIAGNOSIS — I493 Ventricular premature depolarization: Secondary | ICD-10-CM

## 2021-12-08 NOTE — Progress Notes (Signed)
This encounter was created in error - please disregard.

## 2021-12-21 ENCOUNTER — Ambulatory Visit: Payer: 59 | Admitting: Cardiology

## 2021-12-21 ENCOUNTER — Telehealth: Payer: Self-pay | Admitting: *Deleted

## 2021-12-21 ENCOUNTER — Encounter: Payer: Self-pay | Admitting: Cardiology

## 2021-12-21 VITALS — BP 122/70 | HR 60 | Ht 74.0 in | Wt 222.4 lb

## 2021-12-21 DIAGNOSIS — G4733 Obstructive sleep apnea (adult) (pediatric): Secondary | ICD-10-CM | POA: Diagnosis not present

## 2021-12-21 DIAGNOSIS — I493 Ventricular premature depolarization: Secondary | ICD-10-CM

## 2021-12-21 NOTE — Progress Notes (Signed)
Date:  12/21/2021   ID:  TAHMID STONEHOCKER, DOB [birth year], MRN [id number]  PCP:  Lawerance Cruel, MD  Cardiologist:  Candee Furbish, MD  Sleep Medicine:  Fransico Him, MD Electrophysiologist:  Vickie Epley, MD   Chief Complaint:  OSA  History of Present Illness:    Jerry Barry is a 61 y.o. male who presents for followup of obstructive sleep apnea.  He has mild OSA with an AHI of 7.7 events per hour on PSG with mild intermittent snoring and no significant oxygen desaturations. He is on CPAP at 11cm H2O. at his last office visit his device was over 23 years old and did not have 5G capability so requested a new device.  He is now here for follow-up per insurance requirements  He is doing well with his CPAP device and thinks that he has gotten used to it.  He tolerates the mask and feels the pressure is adequate.  Since going on CPAP he feels rested in the am and has no significant daytime sleepiness.  He denies any significant mouth or nasal dryness or nasal congestion.  He does not think that he snores.    Prior CV studies:   The following studies were reviewed today:  PAP compliance download  Past Medical History:  Diagnosis Date   Colon polyps    Diverticulosis    Esophageal stricture    GERD (gastroesophageal reflux disease)    Hemorrhoids    Hiatal hernia    Obesity (BMI 30-39.9) 06/17/2015   OSA (obstructive sleep apnea) 10/04/2014   Mild with AHI 7.7/hr   PVC (premature ventricular contraction)    Sleep apnea    Past Surgical History:  Procedure Laterality Date   arm fx     right arm , 6 th grade    fractured hand Right      No outpatient medications have been marked as taking for the 12/21/21 encounter (Office Visit) with Sueanne Margarita, MD.     Allergies:   Patient has no known allergies.   Social History   Tobacco Use   Smoking status: Never   Smokeless tobacco: Never  Vaping Use   Vaping Use: Never used  Substance Use Topics   Alcohol use: Yes     Comment: socially   Drug use: No     Family Hx: The patient's family history includes Benign prostatic hyperplasia in his father; Colitis in his mother; Colon cancer in his maternal grandfather; Colon polyps in his brother, father, and sister; Diabetes in his father; Heart disease in his mother. There is no history of Esophageal cancer, Rectal cancer, or Stomach cancer.  ROS:   Please see the history of present illness.     All other systems reviewed and are negative.   Labs/Other Tests and Data Reviewed:    Recent Labs: No results found for requested labs within last 8760 hours.   Recent Lipid Panel Lab Results  Component Value Date/Time   CHOL 189 08/18/2019 08:58 AM   TRIG 76 08/18/2019 08:58 AM   HDL 47 08/18/2019 08:58 AM   CHOLHDL 4.0 08/18/2019 08:58 AM   LDLCALC 128 (H) 08/18/2019 08:58 AM    Wt Readings from Last 3 Encounters:  12/08/21 222 lb (100.7 kg)  11/30/21 225 lb (102.1 kg)  10/06/21 228 lb 3.2 oz (103.5 kg)     Objective:    Vital Signs:  There were no vitals taken for this visit.  GEN: Well nourished, well developed in  no acute distress HEENT: Normal NECK: No JVD; No carotid bruits LYMPHATICS: No lymphadenopathy CARDIAC:RRR, no murmurs, rubs, gallops RESPIRATORY:  Clear to auscultation without rales, wheezing or rhonchi  ABDOMEN: Soft, non-tender, non-distended MUSCULOSKELETAL:  No edema; No deformity  SKIN: Warm and dry NEUROLOGIC:  Alert and oriented x 3 PSYCHIATRIC:  Normal affect   ASSESSMENT & PLAN:    1.  OSA - The patient is tolerating PAP therapy well without any problems. The PAP download performed by his DME was personally reviewed and interpreted by me tod0.9ay and showed an AHI of 0.9/hr on 11 cm H2O with 100% compliance in using more than 4 hours nightly.  The patient has been using and benefiting from PAP use and will continue to benefit from therapy.    2.  PVCs -He denies any palpitations  -Continue prescription drug management  Cardizem CD 240 mg daily with as needed refills   Medication Adjustments/Labs and Tests Ordered: Current medicines are reviewed at length with the patient today.  Concerns regarding medicines are outlined above.  Tests Ordered: No orders of the defined types were placed in this encounter.  Medication Changes: No orders of the defined types were placed in this encounter.   Disposition:  Follow up in 1 year(s)  Signed, Fransico Him, MD  12/21/2021 8:13 AM    [facility]

## 2021-12-21 NOTE — Patient Instructions (Signed)

## 2021-12-21 NOTE — Telephone Encounter (Signed)
Per dr Radford Pax, please call patient and let him know his download looked great.

## 2022-01-18 ENCOUNTER — Ambulatory Visit
Admission: RE | Admit: 2022-01-18 | Discharge: 2022-01-18 | Disposition: A | Discharge status: Home / Self Care | Payer: Self-pay | Source: Ambulatory Visit | Attending: Cardiology | Admitting: Cardiology

## 2022-01-18 DIAGNOSIS — I493 Ventricular premature depolarization: Secondary | ICD-10-CM

## 2022-01-18 DIAGNOSIS — Z8249 Family history of ischemic heart disease and other diseases of the circulatory system: Secondary | ICD-10-CM

## 2022-01-18 DIAGNOSIS — R002 Palpitations: Secondary | ICD-10-CM

## 2022-02-01 ENCOUNTER — Encounter: Payer: Self-pay | Admitting: Cardiology

## 2022-02-01 DIAGNOSIS — Z79899 Other long term (current) drug therapy: Secondary | ICD-10-CM

## 2022-02-01 DIAGNOSIS — I2510 Atherosclerotic heart disease of native coronary artery without angina pectoris: Secondary | ICD-10-CM

## 2022-02-02 MED ORDER — ROSUVASTATIN CALCIUM 5 MG PO TABS: 5.0000 mg | ORAL_TABLET | Freq: Every day | ORAL | 3 refills | Status: DC

## 2022-02-15 ENCOUNTER — Encounter: Payer: Self-pay | Admitting: Internal Medicine

## 2022-02-21 ENCOUNTER — Encounter: Payer: Self-pay | Admitting: Cardiology

## 2022-02-22 ENCOUNTER — Other Ambulatory Visit: Payer: Self-pay | Admitting: *Deleted

## 2022-02-22 DIAGNOSIS — Z79899 Other long term (current) drug therapy: Secondary | ICD-10-CM

## 2022-02-22 DIAGNOSIS — I2510 Atherosclerotic heart disease of native coronary artery without angina pectoris: Secondary | ICD-10-CM

## 2022-04-05 ENCOUNTER — Ambulatory Visit: Payer: 59 | Attending: Cardiology

## 2022-04-05 DIAGNOSIS — Z79899 Other long term (current) drug therapy: Secondary | ICD-10-CM

## 2022-04-05 DIAGNOSIS — I2510 Atherosclerotic heart disease of native coronary artery without angina pectoris: Secondary | ICD-10-CM

## 2022-04-06 ENCOUNTER — Encounter: Payer: Self-pay | Admitting: Cardiology

## 2022-04-06 LAB — LIPID PANEL
Chol/HDL Ratio: 3 ratio (ref 0.0–5.0)
Cholesterol, Total: 147 mg/dL (ref 100–199)
HDL: 49 mg/dL (ref >39)
LDL Chol Calc (NIH): 86 mg/dL (ref 0–99)
Triglycerides: 58 mg/dL (ref 0–149)
VLDL Cholesterol Cal: 12 mg/dL (ref 5–40)

## 2022-04-06 LAB — NMR, LIPOPROFILE
Cholesterol, Total: 152 mg/dL (ref 100–199)
HDL Particle Number: 32.2 umol/L (ref >=30.5)
HDL-C: 50 mg/dL (ref >39)
LDL Particle Number: 1116 nmol/L — ABNORMAL HIGH (ref <1000)
LDL Size: 20.8 nm (ref >20.5)
LDL-C (NIH Calc): 90 mg/dL (ref 0–99)
LP-IR Score: 30 (ref <=45)
Small LDL Particle Number: 432 nmol/L (ref <=527)
Triglycerides: 60 mg/dL (ref 0–149)

## 2022-04-06 LAB — ALT: ALT: 19 IU/L (ref 0–44)

## 2022-04-12 ENCOUNTER — Telehealth: Payer: Self-pay | Admitting: *Deleted

## 2022-04-12 DIAGNOSIS — I2510 Atherosclerotic heart disease of native coronary artery without angina pectoris: Secondary | ICD-10-CM

## 2022-04-12 DIAGNOSIS — E7800 Pure hypercholesterolemia, unspecified: Secondary | ICD-10-CM

## 2022-04-12 NOTE — Telephone Encounter (Signed)
LDL 90. Goal < 70 with coronary artery disease  On low dose Crestor '5mg'$   Let's refer to lipid clinic to discuss ways to reduce further.  Candee Furbish, MD   LDL particle Number [phone]  Order placed for pt to be contacted to be schedule with Lipid Clinic.

## 2022-04-16 DIAGNOSIS — E7800 Pure hypercholesterolemia, unspecified: Secondary | ICD-10-CM | POA: Insufficient documentation

## 2022-04-26 ENCOUNTER — Encounter: Payer: Self-pay | Admitting: Internal Medicine

## 2022-06-01 ENCOUNTER — Ambulatory Visit: Payer: 59 | Attending: Cardiovascular Disease | Admitting: Student

## 2022-06-01 DIAGNOSIS — E7800 Pure hypercholesterolemia, unspecified: Secondary | ICD-10-CM

## 2022-06-01 MED ORDER — ROSUVASTATIN CALCIUM 10 MG PO TABS: 10.0000 mg | ORAL_TABLET | Freq: Every day | ORAL | 3 refills | Status: DC

## 2022-06-01 NOTE — Patient Instructions (Signed)
Changes made by your pharmacist Cammy Copa, PharmD at today's visit:    Instructions/Changes  (what do you need to do) Your Notes  (what you did and when you did it)  Increased  Crestor from 5 mg to 10 mg    Will follow up in 2-3 weeks to assess tolerability of the change and we may add Zetia if Crestor 10 mg is well tolerated.       If you have any questions or concerns please use My Chart to send questions or call the office at [phone]

## 2022-06-01 NOTE — Assessment & Plan Note (Addendum)
Assessment: Last LDLc '86mg'$ /dl : above the goal (<70 mg/dl)  Currently on moderate intensity satin - 5 mg rosuvastatin   Patient has mild upper back pain since he started taking rosuvastatin. However, willing to try the higher dose to see how he would react.  Discuss the efficacy of dose increase - may not be able to get at or below the goal with just moving rosuvastatin 5 mg dose to 10 mg. may consider adding Zetia. Willing to make one change at a time so will try rosuvastatin 10 mg first.   Plan  Increase rosuvastatin 5 mg to 10 mg daily  Pharma D will follow up with patient in 2 weeks via phone to assess the tolerability and may add ezetimibe at that point and will repeat lipid and liver lab in 12 weeks

## 2022-06-01 NOTE — Progress Notes (Unsigned)
Patient ID: Jerry Barry                 DOB: [birth year]                    MRN: [id number]      HPI: Jerry Barry is a 61 y.o. male patient referred to lipid clinic by Dr.Skains. PMH is significant for mild OSA, obedsity, GERD, PVC, PAF(paroxysmal atrial fibrillation), BPH.   Patient OSA treated by Dr. Radford Pax and PAF(paroxysmal atrial fibrillation) is managed by Dr.Skains. for PAF(paroxysmal atrial fibrillation) currently on magnesium chlorinate  and diltiazem CD 120 mg.  On 01/18/2022 CAC score was 331, 82nd percentile so Crestor 5 mg was started 02/02/2022 3 months later LDLc level was above the goal (86 mg/dl on 04/05/2022).LDL particles number [id number] (04/05/2022)- moderately elevated.   Today no acute concern reported by patient. Patient is taking rosuvastin 5 mg regularly. Reports experiencing upper back muscle pain since he started taking Crestor 5 mg. Nothing like intolerable pain but notice the pain is there. We discuss the importance of achiveing target LDLc and potential medications options and it's effectiveness  including optimizing dose of statin, adding Zetia or PCSK9i to the the statin.  Current Medications: Crestor 5 mg (started 02/02/2022) Intolerances: None  Risk Factors: CAD, family hx of early CAD (mother) LDL goal: <70 mg/dl  Diet: healthy eater - eats whole food , cut down on red meat and carbohydrate. Eats lots of fish and chicken. Drinks lots of water during the day   Water   Exercise:  Work out-weight lifting 4 time per week and  Walking 1 hour or treadmill 20 min various pace - 2 to 3 X week   Social History:  Alcohol and tobacco - none   Labs: Lipid Panel     Component Value Date/Time   CHOL 147 04/05/2022 0935   TRIG 58 04/05/2022 0935   HDL 49 04/05/2022 0935   CHOLHDL 3.0 04/05/2022 0935   LDLCALC 86 04/05/2022 0935   LABVLDL 12 04/05/2022 0935    Past Medical History:  Diagnosis Date   Colon polyps    Diverticulosis    Esophageal stricture     GERD (gastroesophageal reflux disease)    Hemorrhoids    Hiatal hernia    Obesity (BMI 30-39.9) 06/17/2015   OSA (obstructive sleep apnea) 10/04/2014   Mild with AHI 7.7/hr   PVC (premature ventricular contraction)    Sleep apnea     Current Outpatient Medications on File Prior to Visit  Medication Sig Dispense Refill   aspirin 81 MG chewable tablet Chew 81 mg by mouth daily.     diltiazem (CARDIZEM CD) 120 MG 24 hr capsule Take 1 capsule (120 mg total) by mouth daily. 90 capsule 3   MULTIPLE VITAMIN PO Take by mouth.     omeprazole (PRILOSEC) 10 MG capsule Take 10 mg by mouth every other day.     OVER THE COUNTER MEDICATION Saw Palmetto, 1 gel cap, 160 mg. daily     No current facility-administered medications on file prior to visit.    No Known Allergies  Assessment/Plan:  1. Hyperlipidemia -  Hypercholesterolemia Assessment: Last LDLc '86mg'$ /dl : above the goal (<70 mg/dl)  Currently on moderate intensity satin - 5 mg rosuvastatin   Patient has mild upper back pain since he started taking rosuvastatin. However, willing to try the higher dose to see how he would react.  Discuss the efficacy of dose increase - may  not be able to get at or below the goal with just moving rosuvastatin 5 mg dose to 10 mg. may consider adding Zetia. Willing to make one change at a time so will try rosuvastatin 10 mg first.   Plan  Increase rosuvastatin 5 mg to 10 mg daily  Pharma D will follow up with patient in 2 weeks via phone to assess the tolerability and may add ezetimibe at that point and will repeat lipid and liver lab in 12 weeks    Thank you,  Cammy Copa, Pharm.D [facility] [address], [place], [place] [address]  Phone: [phone]; Fax: [phone]

## 2022-06-14 ENCOUNTER — Telehealth: Payer: Self-pay | Admitting: Pharmacist

## 2022-06-14 DIAGNOSIS — E7800 Pure hypercholesterolemia, unspecified: Secondary | ICD-10-CM

## 2022-06-14 MED ORDER — EZETIMIBE 10 MG PO TABS: 10.0000 mg | ORAL_TABLET | Freq: Every day | ORAL | 3 refills | Status: DC

## 2022-06-14 NOTE — Telephone Encounter (Signed)
Call to see if patient tolerating rosuvastatin dose increment from 5 mg to 10 mg daily.  Reports  tolerating 10 mg without any major issues. Willing to add ezetimibe as per out previous conversation  Will repeat lab - lipid and LFTs in 12 weeks

## 2022-06-15 ENCOUNTER — Ambulatory Visit (AMBULATORY_SURGERY_CENTER): Payer: Self-pay

## 2022-06-15 VITALS — Ht 74.0 in | Wt 230.0 lb

## 2022-06-15 DIAGNOSIS — Z800 Family history of malignant neoplasm of digestive organs: Secondary | ICD-10-CM

## 2022-06-15 MED ORDER — NA SULFATE-K SULFATE-MG SULF 17.5-3.13-1.6 GM/177ML PO SOLN: 1.0000 | Freq: Once | ORAL | 0 refills | Status: AC

## 2022-06-15 NOTE — Progress Notes (Signed)

## 2022-06-27 ENCOUNTER — Encounter: Payer: Self-pay | Admitting: Internal Medicine

## 2022-07-02 ENCOUNTER — Encounter: Payer: Self-pay | Admitting: Internal Medicine

## 2022-07-02 ENCOUNTER — Ambulatory Visit (AMBULATORY_SURGERY_CENTER): Payer: 59 | Admitting: Internal Medicine

## 2022-07-02 VITALS — BP 116/59 | HR 61 | Temp 98.9°F | Resp 16 | Ht 74.0 in | Wt 230.0 lb

## 2022-07-02 DIAGNOSIS — Z83719 Family history of colon polyps, unspecified: Secondary | ICD-10-CM

## 2022-07-02 DIAGNOSIS — Z800 Family history of malignant neoplasm of digestive organs: Secondary | ICD-10-CM

## 2022-07-02 DIAGNOSIS — D125 Benign neoplasm of sigmoid colon: Secondary | ICD-10-CM

## 2022-07-02 DIAGNOSIS — Z1211 Encounter for screening for malignant neoplasm of colon: Secondary | ICD-10-CM

## 2022-07-02 MED ORDER — SODIUM CHLORIDE 0.9 % IV SOLN: 500.0000 mL | Freq: Once | INTRAVENOUS | Status: DC

## 2022-07-02 MED ORDER — FLEET ENEMA 7-19 GM/118ML RE ENEM
1.0000 | ENEMA | Freq: Once | RECTAL | Status: AC
  Administered 2022-07-02: 1 via RECTAL

## 2022-07-02 NOTE — Progress Notes (Signed)
Pt's states no medical or surgical changes since previsit or office visit. 

## 2022-07-02 NOTE — Op Note (Signed)
[facility] Patient Name: Jerry Barry Procedure Date: 07/02/2022 9:25 AM MRN: [id number] Endoscopist: Docia Chuck. Henrene Pastor , MD, [id number] Age: 61 Referring MD:  Date of Birth: [birth year] Gender: Male Account #: [id number] Procedure:                Colonoscopy with cold snare polypectomy x 1 Indications:              Screening for colorectal malignant neoplasm. Father                            with advanced polypoid lesion less than age 33 and                            second-degree relatives with colon cancer. Previous                            examinations 2007, 2013, 2018 were all negative for                            neoplasia Medicines:                Monitored Anesthesia Care Procedure:                Pre-Anesthesia Assessment:                           - Prior to the procedure, a History and Physical                            was performed, and patient medications and                            allergies were reviewed. The patient's tolerance of                            previous anesthesia was also reviewed. The risks                            and benefits of the procedure and the sedation                            options and risks were discussed with the patient.                            All questions were answered, and informed consent                            was obtained. Prior Anticoagulants: The patient has                            taken no anticoagulant or antiplatelet agents. ASA                            Grade Assessment: II - A patient with mild systemic  disease. After reviewing the risks and benefits,                            the patient was deemed in satisfactory condition to                            undergo the procedure.                           After obtaining informed consent, the colonoscope                            was passed under direct vision. Throughout the                            procedure, the  patient's blood pressure, pulse, and                            oxygen saturations were monitored continuously. The                            CF [id number] #[id number] was introduced through the anus                            and advanced to the the cecum, identified by                            appendiceal orifice and ileocecal valve. The                            ileocecal valve, appendiceal orifice, and rectum                            were photographed. The quality of the bowel                            preparation was excellent. The colonoscopy was                            performed without difficulty. The patient tolerated                            the procedure well. The bowel preparation used was                            SUPREP via split dose instruction. Scope In: 9:35:51 AM Scope Out: 9:49:13 AM Scope Withdrawal Time: 0 hours 11 minutes 14 seconds  Total Procedure Duration: 0 hours 13 minutes 22 seconds  Findings:                 A 4 mm polyp was found in the sigmoid colon. The                            polyp was removed with a cold snare. Resection  and                            retrieval were complete.                           The exam was otherwise without abnormality on                            direct and retroflexion views. Complications:            No immediate complications. Estimated blood loss:                            None. Estimated Blood Loss:     Estimated blood loss: none. Impression:               - One 4 mm polyp in the sigmoid colon, removed with                            a cold snare. Resected and retrieved.                           - The examination was otherwise normal on direct                            and retroflexion views. Recommendation:           - Repeat colonoscopy in 5 years for surveillance                            (family history).                           - Patient has a contact number available for                             emergencies. The signs and symptoms of potential                            delayed complications were discussed with the                            patient. Return to normal activities tomorrow.                            Written discharge instructions were provided to the                            patient.                           - Resume previous diet.                           - Continue present medications.                           -  Await pathology results. Docia Chuck. Henrene Pastor, MD 07/02/2022 10:01:28 AM This report has been signed electronically.

## 2022-07-02 NOTE — Progress Notes (Signed)
Called to room to assist during endoscopic procedure.  Patient ID and intended procedure confirmed with present staff. Received instructions for my participation in the procedure from the performing physician.  

## 2022-07-02 NOTE — Progress Notes (Signed)
Sedate, gd SR, tolerated procedure well, VSS, report to RN 

## 2022-07-02 NOTE — Progress Notes (Signed)
HISTORY OF PRESENT ILLNESS:  Jerry Barry is a 61 y.o. male with a family history of premature advanced colon polyps in his father less than age 20 and second-degree relatives with colon cancer.  Multiple previous screening colonoscopies negative for neoplasia.  Now for follow-up screening  REVIEW OF SYSTEMS:  All non-GI ROS negative. Past Medical History:  Diagnosis Date   Colon polyps    Diverticulosis    Esophageal stricture    GERD (gastroesophageal reflux disease)    Hemorrhoids    Hiatal hernia    Hyperlipidemia    Obesity (BMI 30-39.9) 06/17/2015   OSA (obstructive sleep apnea) 10/04/2014   Mild with AHI 7.7/hr   PVC (premature ventricular contraction)    Sleep apnea     Past Surgical History:  Procedure Laterality Date   arm fx     right arm , 6 th grade    fractured hand Right     Social History Jerry Barry  reports that he has never smoked. He has never used smokeless tobacco. He reports current alcohol use. He reports that he does not use drugs.  family history includes Benign prostatic hyperplasia in his father; Colitis in his mother; Colon cancer in his maternal grandfather; Colon polyps in his brother, father, and sister; Diabetes in his father; Heart disease in his mother.  No Known Allergies     PHYSICAL EXAMINATION: Vital signs: BP (!) 152/70   Pulse 65   Temp 98.9 F (37.2 C)   Ht '6\' 2"'$  (1.88 m)   Wt 230 lb (104.3 kg)   SpO2 98%   BMI 29.53 kg/m  General: Well-developed, well-nourished, no acute distress HEENT: Sclerae are anicteric, conjunctiva pink. Oral mucosa intact Lungs: Clear Heart: Regular Abdomen: soft, nontender, nondistended, no obvious ascites, no peritoneal signs, normal bowel sounds. No organomegaly. Extremities: No edema Psychiatric: alert and oriented x3. Cooperative     ASSESSMENT:  Family history of colon cancer and colon polyps as described   PLAN:  Screening colonoscopy

## 2022-07-02 NOTE — Patient Instructions (Signed)
   Handout on polyps given to you today  Await pathology results on polyp removed     YOU HAD AN ENDOSCOPIC PROCEDURE TODAY AT [facility]:   Refer to the procedure report that was given to you for any specific questions about what was found during the examination.  If the procedure report does not answer your questions, please call your gastroenterologist to clarify.  If you requested that your care partner not be given the details of your procedure findings, then the procedure report has been included in a sealed envelope for you to review at your convenience later.  YOU SHOULD EXPECT: Some feelings of bloating in the abdomen. Passage of more gas than usual.  Walking can help get rid of the air that was put into your GI tract during the procedure and reduce the bloating. If you had a lower endoscopy (such as a colonoscopy or flexible sigmoidoscopy) you may notice spotting of blood in your stool or on the toilet paper. If you underwent a bowel prep for your procedure, you may not have a normal bowel movement for a few days.  Please Note:  You might notice some irritation and congestion in your nose or some drainage.  This is from the oxygen used during your procedure.  There is no need for concern and it should clear up in a day or so.  SYMPTOMS TO REPORT IMMEDIATELY:  Following lower endoscopy (colonoscopy or flexible sigmoidoscopy):  Excessive amounts of blood in the stool  Significant tenderness or worsening of abdominal pains  Swelling of the abdomen that is new, acute  Fever of 100F or higher   For urgent or emergent issues, a gastroenterologist can be reached at any hour by calling [phone]. Do not use MyChart messaging for urgent concerns.    DIET:  We do recommend a small meal at first, but then you may proceed to your regular diet.  Drink plenty of fluids but you should avoid alcoholic beverages for 24 hours.  ACTIVITY:  You should plan to take it easy  for the rest of today and you should NOT DRIVE or use heavy machinery until tomorrow (because of the sedation medicines used during the test).    FOLLOW UP: Our staff will call the number listed on your records the next business day following your procedure.  We will call around 7:15- 8:00 am to check on you and address any questions or concerns that you may have regarding the information given to you following your procedure. If we do not reach you, we will leave a message.     If any biopsies were taken you will be contacted by phone or by letter within the next 1-3 weeks.  Please call us at [phone] if you have not heard about the biopsies in 3 weeks.    SIGNATURES/CONFIDENTIALITY: You and/or your care partner have signed paperwork which will be entered into your electronic medical record.  These signatures attest to the fact that that the information above on your After Visit Summary has been reviewed and is understood.  Full responsibility of the confidentiality of this discharge information lies with you and/or your care-partner.

## 2022-07-02 NOTE — Progress Notes (Signed)
Patient reports last movement, dark translucent yellow with some small flecks of stool, Dr. Henrene Pastor notified and order taken to Fleets enema, adm Fleets enema with return of translucent yellow fluid, no flecks of stool noted, patient states it is clearer than previous. Dr. Henrene Pastor notified.

## 2022-07-03 ENCOUNTER — Telehealth: Payer: Self-pay

## 2022-07-03 NOTE — Telephone Encounter (Signed)
  Follow up Call-     07/02/2022    8:30 AM  Call back number  Post procedure Call Back phone  # [phone]  Permission to leave phone message Yes     Patient questions:  Do you have a fever, pain , or abdominal swelling? No. Pain Score  0 *  Have you tolerated food without any problems? Yes.    Have you been able to return to your normal activities? Yes.    Do you have any questions about your discharge instructions: Diet   No. Medications  No. Follow up visit  No.  Do you have questions or concerns about your Care? No.  Actions: * If pain score is 4 or above: No action needed, pain <4.

## 2022-07-04 ENCOUNTER — Encounter: Payer: Self-pay | Admitting: Internal Medicine

## 2022-09-10 ENCOUNTER — Telehealth: Payer: Self-pay | Admitting: Cardiology

## 2022-09-10 DIAGNOSIS — E7800 Pure hypercholesterolemia, unspecified: Secondary | ICD-10-CM

## 2022-09-10 DIAGNOSIS — Z79899 Other long term (current) drug therapy: Secondary | ICD-10-CM

## 2022-09-10 NOTE — Telephone Encounter (Signed)
CK ordered and linked to appt.

## 2022-09-10 NOTE — Telephone Encounter (Signed)
New Message:      Patient says he is coming in on Thursday(09-13-22) for lab work. He said he would like for Dr Gillian Shields to add  Creatinine Kinase to his order please.

## 2022-09-13 ENCOUNTER — Ambulatory Visit: Payer: 59 | Attending: Cardiology

## 2022-09-13 DIAGNOSIS — E7800 Pure hypercholesterolemia, unspecified: Secondary | ICD-10-CM

## 2022-09-13 DIAGNOSIS — Z79899 Other long term (current) drug therapy: Secondary | ICD-10-CM

## 2022-09-13 LAB — CK: Total CK: 180 U/L (ref 41–331)

## 2022-09-14 LAB — HEPATIC FUNCTION PANEL
ALT: 18 IU/L (ref 0–44)
AST: 20 IU/L (ref 0–40)
Albumin: 4.3 g/dL (ref 3.9–4.9)
Alkaline Phosphatase: 53 IU/L (ref 44–121)
Bilirubin Total: 0.5 mg/dL (ref 0.0–1.2)
Bilirubin, Direct: 0.13 mg/dL (ref 0.00–0.40)
Total Protein: 6.6 g/dL (ref 6.0–8.5)

## 2022-09-14 LAB — LIPID PANEL
Chol/HDL Ratio: 3 ratio (ref 0.0–5.0)
Cholesterol, Total: 126 mg/dL (ref 100–199)
HDL: 42 mg/dL (ref >39)
LDL Chol Calc (NIH): 72 mg/dL (ref 0–99)
Triglycerides: 55 mg/dL (ref 0–149)
VLDL Cholesterol Cal: 12 mg/dL (ref 5–40)

## 2022-09-18 ENCOUNTER — Other Ambulatory Visit: Payer: Self-pay | Admitting: Cardiology

## 2022-11-05 ENCOUNTER — Telehealth: Payer: Self-pay | Admitting: Internal Medicine

## 2022-11-05 NOTE — Telephone Encounter (Signed)
Reviewed. He appears to be okay for direct EGD in [facility] with dilation. Thanks

## 2022-11-05 NOTE — Telephone Encounter (Signed)
Pt states when he saw Dr. Marina Goodell last year for his colonoscopy he was told if he started having issues to call back and schedule an EGD. Pt reports he has been having issues with feeling like certain foods are getting stuck in his throat. Also states he thinks he is having spasms in his esophagus, feels like a cramp. Can pt be a direct EGD or do you want to see him for an OV, please advise.

## 2022-11-05 NOTE — Telephone Encounter (Signed)
Inbound call from patient wanting to speak with a nurse in regards some symptoms he is having and the possibility to have a endoscopy.Please advise ,Thanks

## 2022-11-06 ENCOUNTER — Other Ambulatory Visit: Payer: Self-pay

## 2022-11-06 DIAGNOSIS — R1310 Dysphagia, unspecified: Secondary | ICD-10-CM

## 2022-11-06 NOTE — Telephone Encounter (Signed)
Pt scheduled for EGD with dil in the [facility] 11/16/22 at 10am, pt knows to arrive at 9am. Amb ref entered in epic, pt sent prep instructions via mychart.

## 2022-11-16 ENCOUNTER — Encounter: Payer: Self-pay | Admitting: Internal Medicine

## 2022-11-16 ENCOUNTER — Ambulatory Visit (AMBULATORY_SURGERY_CENTER): Payer: 59 | Admitting: Internal Medicine

## 2022-11-16 VITALS — BP 111/68 | HR 53 | Temp 97.5°F | Resp 8 | Ht 73.0 in | Wt 235.0 lb

## 2022-11-16 DIAGNOSIS — K222 Esophageal obstruction: Secondary | ICD-10-CM | POA: Diagnosis not present

## 2022-11-16 DIAGNOSIS — K219 Gastro-esophageal reflux disease without esophagitis: Secondary | ICD-10-CM

## 2022-11-16 DIAGNOSIS — R1310 Dysphagia, unspecified: Secondary | ICD-10-CM | POA: Diagnosis not present

## 2022-11-16 MED ORDER — SODIUM CHLORIDE 0.9 % IV SOLN
500.0000 mL | Freq: Once | INTRAVENOUS | Status: DC
Start: 2022-11-16 — End: 2022-11-16

## 2022-11-16 MED ORDER — OMEPRAZOLE 20 MG PO CPDR
20.0000 mg | DELAYED_RELEASE_CAPSULE | Freq: Every day | ORAL | 11 refills | Status: DC
Start: 2022-11-16 — End: 2023-01-17

## 2022-11-16 NOTE — Progress Notes (Signed)
HISTORY OF PRESENT ILLNESS:  Jerry Barry is a 62 y.o. male with chronic GERD for which he takes 10 mg of omeprazole every other day.  He states this controls symptoms.  He has had intermittent solid food dysphagia.  Now for upper endoscopy with probable esophageal dilation  REVIEW OF SYSTEMS:  All non-GI ROS negative except for  Past Medical History:  Diagnosis Date   Colon polyps    Diverticulosis    Esophageal stricture    GERD (gastroesophageal reflux disease)    Hemorrhoids    Hiatal hernia    Hyperlipidemia    Obesity (BMI 30-39.9) 06/17/2015   OSA (obstructive sleep apnea) 10/04/2014   Mild with AHI 7.7/hr   PVC (premature ventricular contraction)    Sleep apnea     Past Surgical History:  Procedure Laterality Date   arm fx     right arm , 6 th grade    fractured hand Right     Social History Jerry Barry  reports that he has never smoked. He has never used smokeless tobacco. He reports current alcohol use. He reports that he does not use drugs.  family history includes Benign prostatic hyperplasia in his father; Colitis in his mother; Colon cancer in his maternal grandfather; Colon polyps in his brother, father, and sister; Diabetes in his father; Heart disease in his mother.  No Known Allergies     PHYSICAL EXAMINATION: Vital signs: BP 131/74   Pulse (!) 59   Temp (!) 97.5 F (36.4 C)   Resp (!) 9   Ht  (1.854 m)   Wt 235 lb (106.6 kg)   SpO2 98%   BMI 31.00 kg/m  General: Well-developed, well-nourished, no acute distress HEENT: Sclerae are anicteric, conjunctiva pink. Oral mucosa intact Lungs: Clear Heart: Regular Abdomen: soft, nontender, nondistended, no obvious ascites, no peritoneal signs, normal bowel sounds. No organomegaly. Extremities: No edema Psychiatric: alert and oriented x3. Cooperative     ASSESSMENT:  1.  GERD 2.  Intermittent solid food dysphagia   PLAN:  EGD with esophageal dilation

## 2022-11-16 NOTE — Progress Notes (Signed)
Pt's states no medical or surgical changes since previsit or office visit. 

## 2022-11-16 NOTE — Progress Notes (Signed)
A and O x3. Report to RN. Tolerated MAC anesthesia well.Teeth unchanged after procedure. 

## 2022-11-16 NOTE — Op Note (Signed)
[facility] Patient Name: Jerry Barry Procedure Date: 11/16/2022 10:34 AM MRN: [id number] Endoscopist: Wilhemina Bonito. Marina Goodell , MD, [id number] Age: 62 Referring MD:  Date of Birth: [birth year] Gender: Male Account #: [id number] Procedure:                Upper GI endoscopy with [facility] dilation of the                            esophagus. 13 French Indications:              Dysphagia, Esophageal reflux Medicines:                Monitored Anesthesia Care Procedure:                Pre-Anesthesia Assessment:                           - Prior to the procedure, a History and Physical                            was performed, and patient medications and                            allergies were reviewed. The patient's tolerance of                            previous anesthesia was also reviewed. The risks                            and benefits of the procedure and the sedation                            options and risks were discussed with the patient.                            All questions were answered, and informed consent                            was obtained. Prior Anticoagulants: The patient has                            taken no anticoagulant or antiplatelet agents.                            After reviewing the risks and benefits, the patient                            was deemed in satisfactory condition to undergo the                            procedure.                           After obtaining informed consent, the endoscope was  passed under direct vision. Throughout the                            procedure, the patient's blood pressure, pulse, and                            oxygen saturations were monitored continuously. The                            Olympus Scope [id number] was introduced through the                            mouth, and advanced to the second part of duodenum.                            The upper GI endoscopy was accomplished  without                            difficulty. The patient tolerated the procedure                            well. Scope In: Scope Out: Findings:                 One benign-appearing, intrinsic moderate stenosis                            was found 40 cm from the incisors. This stenosis                            measured 1.4 cm (inner diameter). The scope was                            withdrawn. Dilation was performed with a Maloney                            dilator with no resistance at 54 Fr.                           The exam of the esophagus revealed mild edema at                            the gastroesophageal junction but was otherwise                            normal.                           The stomach was normal, save moderate hiatal hernia.                           The examined duodenum was normal.                           The cardia and gastric fundus were normal on  retroflexion. Complications:            No immediate complications. Estimated Blood Loss:     Estimated blood loss: none. Impression:               1. GERD complicated by peptic stricture status post                            dilation                           2. Hiatal hernia                           3. Otherwise normal EGD. Recommendation:           - Patient has a contact number available for                            emergencies. The signs and symptoms of potential                            delayed complications were discussed with the                            patient. Return to normal activities tomorrow.                            Written discharge instructions were provided to the                            patient.                           - Post dilation diet.                           - Continue present medications.                           ?" Prescribe omeprazole 20 mg daily; #30; 11 refills.                           ?" Office follow-up with Dr. Marina Goodell in  6 to 8 weeks Wilhemina Bonito. Marina Goodell, MD 11/16/2022 10:59:33 AM This report has been signed electronically.

## 2022-11-16 NOTE — Progress Notes (Signed)
Called to room to assist during endoscopic procedure.  Patient ID and intended procedure confirmed with present staff. Received instructions for my participation in the procedure from the performing physician.  

## 2022-11-16 NOTE — Patient Instructions (Signed)
Handouts provided on hiatal hernia and post dilation diet.   Continue present medications.  Start taking Omeprazole  by mouth daily.  Office follow-up with Dr. Marina Goodell in 6-8 weeks.   YOU HAD AN ENDOSCOPIC PROCEDURE TODAY AT THE [facility]:   Refer to the procedure report that was given to you for any specific questions about what was found during the examination.  If the procedure report does not answer your questions, please call your gastroenterologist to clarify.  If you requested that your care partner not be given the details of your procedure findings, then the procedure report has been included in a sealed envelope for you to review at your convenience later.  YOU SHOULD EXPECT: Some feelings of bloating in the abdomen. Passage of more gas than usual.  Walking can help get rid of the air that was put into your GI tract during the procedure and reduce the bloating. If you had a lower endoscopy (such as a colonoscopy or flexible sigmoidoscopy) you may notice spotting of blood in your stool or on the toilet paper. If you underwent a bowel prep for your procedure, you may not have a normal bowel movement for a few days.  Please Note:  You might notice some irritation and congestion in your nose or some drainage.  This is from the oxygen used during your procedure.  There is no need for concern and it should clear up in a day or so.  SYMPTOMS TO REPORT IMMEDIATELY:  Following upper endoscopy (EGD)  Vomiting of blood or coffee ground material  New chest pain or pain under the shoulder blades  Painful or persistently difficult swallowing  New shortness of breath  Fever of 100F or higher  Black, tarry-looking stools  For urgent or emergent issues, a gastroenterologist can be reached at any hour by calling [phone]. Do not use MyChart messaging for urgent concerns.    DIET:  Post dilation diet: Clear liquids for 1 hour (until 12pm). Then a Soft diet (see handout) for  the rest of today. You may resume your regular diet tomorrow. Drink plenty of fluids but you should avoid alcoholic beverages for 24 hours.  ACTIVITY:  You should plan to take it easy for the rest of today and you should NOT DRIVE or use heavy machinery until tomorrow (because of the sedation medicines used during the test).    FOLLOW UP: Our staff will call the number listed on your records the next business day following your procedure.  We will call around 7:15- 8:00 am to check on you and address any questions or concerns that you may have regarding the information given to you following your procedure. If we do not reach you, we will leave a message.     If any biopsies were taken you will be contacted by phone or by letter within the next 1-3 weeks.  Please call us at [phone] if you have not heard about the biopsies in 3 weeks.    SIGNATURES/CONFIDENTIALITY: You and/or your care partner have signed paperwork which will be entered into your electronic medical record.  These signatures attest to the fact that that the information above on your After Visit Summary has been reviewed and is understood.  Full responsibility of the confidentiality of this discharge information lies with you and/or your care-partner.

## 2022-11-19 ENCOUNTER — Telehealth: Payer: Self-pay | Admitting: *Deleted

## 2022-11-19 NOTE — Telephone Encounter (Signed)
  Follow up Call-     11/16/2022    9:40 AM 07/02/2022    8:30 AM  Call back number  Post procedure Call Back phone  # [phone]  Permission to leave phone message Yes Yes     Patient questions:  Do you have a fever, pain , or abdominal swelling? No. Pain Score  0 *  Have you tolerated food without any problems? Yes.    Have you been able to return to your normal activities? Yes.    Do you have any questions about your discharge instructions: Diet   No. Medications  No. Follow up visit  No.  Do you have questions or concerns about your Care? No.  Actions: * If pain score is 4 or above: No action needed, pain <4.

## 2022-12-13 ENCOUNTER — Other Ambulatory Visit: Payer: Self-pay | Admitting: Cardiology

## 2022-12-27 ENCOUNTER — Other Ambulatory Visit: Payer: Self-pay | Admitting: Cardiology

## 2023-01-08 ENCOUNTER — Other Ambulatory Visit: Payer: Self-pay | Admitting: Cardiology

## 2023-01-17 ENCOUNTER — Ambulatory Visit: Payer: 59 | Admitting: Internal Medicine

## 2023-01-17 ENCOUNTER — Encounter: Payer: Self-pay | Admitting: Internal Medicine

## 2023-01-17 VITALS — BP 100/60 | HR 56 | Ht 73.0 in | Wt 233.5 lb

## 2023-01-17 DIAGNOSIS — K219 Gastro-esophageal reflux disease without esophagitis: Secondary | ICD-10-CM | POA: Diagnosis not present

## 2023-01-17 MED ORDER — OMEPRAZOLE 20 MG PO CPDR
20.0000 mg | DELAYED_RELEASE_CAPSULE | Freq: Every day | ORAL | 3 refills | Status: DC
Start: 2023-01-17 — End: 2024-02-03

## 2023-01-17 NOTE — Progress Notes (Signed)
HISTORY OF PRESENT ILLNESS:  Jerry Barry is a 62 y.o. male with a history of GERD complicated by peptic stricture, family history of colon cancer, personal history of adenomatous colon polyp.  Patient presents today for follow-up after recent endoscopy with esophageal dilation.  Patient underwent upper endoscopy November 16, 2022 for problems with reflux and dysphagia.  He was found to have a distal esophageal stricture and a moderate hiatal hernia.  Esophagus was dilated with a 54 [place] Maloney dilator.  He was prescribed omeprazole 20 mg daily.  He follows up at this time.  Patient reports no reflux symptoms.  Tolerating medication well.  No further issues with dysphagia.  He is pleased.  No new complaints.  His last colonoscopy was December 2023.  REVIEW OF SYSTEMS:  All non-GI ROS negative Past Medical History:  Diagnosis Date   Colon polyps    Diverticulosis    Esophageal stricture    GERD (gastroesophageal reflux disease)    Hemorrhoids    Hiatal hernia    Hyperlipidemia    Obesity (BMI 30-39.9) 06/17/2015   OSA (obstructive sleep apnea) 10/04/2014   Mild with AHI 7.7/hr   PVC (premature ventricular contraction)    Sleep apnea     Past Surgical History:  Procedure Laterality Date   arm fx     right arm , 6 th grade    fractured hand Right     Social History LINDBURG BANNER  reports that he has never smoked. He has never used smokeless tobacco. He reports current alcohol use. He reports that he does not use drugs.  family history includes Benign prostatic hyperplasia in his father; Colitis in his mother; Colon cancer in his maternal grandfather; Colon polyps in his brother, father, and sister; Diabetes in his father; Heart disease in his mother.  No Known Allergies     PHYSICAL EXAMINATION: Vital signs: BP 100/60 (BP Location: Left Arm, Patient Position: Sitting, Cuff Size: Large)   Pulse (!) 56   Ht 6\' 1"  (1.854 m)   Wt 233 lb 8 oz (105.9 kg)   BMI 30.81 kg/m    Constitutional: generally well-appearing, no acute distress Psychiatric: alert and oriented x3, cooperative Eyes: Anicteric Abdomen: Not reexamined Extremities: no visible abnormalities of the extremities Skin: no lesions on visible extremities Neuro: Nonfocal  ASSESSMENT:  1.  GERD complicated by peptic stricture.  Asymptomatic post dilation on PPI 2.  History of adenomatous colon polyp 3.  Family history of colon cancer   PLAN:  1.  Reflux precautions 2.  Continue omeprazole.  Prescription refilled.  Medication risk reviewed. 3.  Routine office follow-up 1 year.  Contact the office in the interim for any questions or problems. 4.  Surveillance colonoscopy around December 2028

## 2023-01-17 NOTE — Patient Instructions (Signed)
We have sent the following medications to your pharmacy for you to pick up at your convenience:  Omeprazole.  Please follow up in one year  

## 2023-01-18 ENCOUNTER — Other Ambulatory Visit: Payer: Self-pay | Admitting: Cardiology

## 2023-03-09 ENCOUNTER — Other Ambulatory Visit: Payer: Self-pay | Admitting: Cardiology

## 2023-03-27 ENCOUNTER — Other Ambulatory Visit: Payer: Self-pay

## 2023-03-27 MED ORDER — ROSUVASTATIN CALCIUM 10 MG PO TABS: 10.0000 mg | ORAL_TABLET | Freq: Every day | ORAL | 0 refills | Status: DC

## 2023-03-27 NOTE — Telephone Encounter (Signed)
Pt's medication was sent to pt's pharmacy as requested. Confirmation received.  °

## 2023-04-24 ENCOUNTER — Other Ambulatory Visit: Payer: Self-pay | Admitting: Cardiology

## 2023-05-13 ENCOUNTER — Ambulatory Visit: Payer: 59 | Attending: Cardiology | Admitting: Cardiology

## 2023-05-13 ENCOUNTER — Encounter: Payer: Self-pay | Admitting: Cardiology

## 2023-05-13 VITALS — BP 134/72 | HR 72 | Ht 74.0 in | Wt 227.0 lb

## 2023-05-13 DIAGNOSIS — E7800 Pure hypercholesterolemia, unspecified: Secondary | ICD-10-CM | POA: Diagnosis not present

## 2023-05-13 DIAGNOSIS — I2510 Atherosclerotic heart disease of native coronary artery without angina pectoris: Secondary | ICD-10-CM

## 2023-05-13 DIAGNOSIS — I2583 Coronary atherosclerosis due to lipid rich plaque: Secondary | ICD-10-CM

## 2023-05-13 DIAGNOSIS — Z789 Other specified health status: Secondary | ICD-10-CM

## 2023-05-13 DIAGNOSIS — Z79899 Other long term (current) drug therapy: Secondary | ICD-10-CM | POA: Diagnosis not present

## 2023-05-13 NOTE — Progress Notes (Signed)
Cardiology Office Note:  .   Date:  05/13/2023  ID:  [name], DOB 1961-06-11, MRN [id number] PCP: Daisy Floro, MD  [facility] Providers Cardiologist:  Donato Schultz, MD Electrophysiologist:  Lanier Prude, MD  Sleep Medicine:  Armanda Magic, MD     History of Present Illness: .   Jerry Barry is a 62 y.o. male Discussed with the use of AI scribe software   History of Present Illness   The patient, a 62 year old with a history of coronary artery disease, presents for a follow-up visit.   He had a coronary CT scan performed which showed a coronary calcium score of 331, placing him in the 82nd percentile. The patient was started on Crestor 10mg  daily, which was later reduced to 5mg  daily due to muscle issues. The goal LDL level is less than 70, with the last recorded LDL level being 72 in February of 2024.  The patient's history includes an episode of atrial fibrillation after a hot day of sweating and another episode while watching a movie. He has been on a monitor which showed less than 1% atrial fibrillation burden. An echocardiogram showed a low normal ejection fraction of 50-55%. He is currently on Cardizem CD 120mg  daily and aspirin 81mg  daily. He has a family history of early coronary artery disease, with his mother having coronary disease.  The patient reports muscle issues with Crestor, which worsened with the addition of Zetia. He decided to discontinue Zetia due to the muscle issues. He also reports pelvic floor spasms and pain, which he believes may be associated with Crestor. He expresses interest in trying a different statin with fewer muscle issues, such as pravastatin.  The patient has been proactive in managing his health, including requesting a lipid panel and reading up on his condition. He expresses a desire to have another lipid panel done to check his particle number. He also expresses concern about his coronary calcium score, which increased from  zero to 331 in a span of seven to eight years.   He is open to trying new medications, such as PCSK9 inhibitors, to manage his cholesterol levels, but worried about potential side effects.           Studies Reviewed: Marland Kitchen   EKG Interpretation Date/Time:  Monday May 13 2023 13:06:12 EDT Ventricular Rate:  68 PR Interval:  160 QRS Duration:  96 QT Interval:  386 QTC Calculation: 410 R Axis:   31  Text Interpretation: Normal sinus rhythm Normal ECG No previous ECGs available Confirmed by Donato Schultz ([id number]) on 05/13/2023 1:23:20 PM    Labs reviewed including lipoprotein NMR analysis  Risk Assessment/Calculations:            Physical Exam:   VS:  BP 134/72   Pulse 72   Ht 6\' 2"  (1.88 m)   Wt 227 lb (103 kg)   SpO2 98%   BMI 29.15 kg/m    Wt Readings from Last 3 Encounters:  05/13/23 227 lb (103 kg)  01/17/23 233 lb 8 oz (105.9 kg)  11/16/22 235 lb (106.6 kg)    GEN: Well nourished, well developed in no acute distress NECK: No JVD; No carotid bruits CARDIAC: RRR, no murmurs, no rubs, no gallops RESPIRATORY:  Clear to auscultation without rales, wheezing or rhonchi  ABDOMEN: Soft, non-tender, non-distended EXTREMITIES:  No edema; No deformity   ASSESSMENT AND PLAN: .    Assessment and Plan    Coronary Artery Disease High coronary  calcium score (331, 82nd percentile) with LDL slightly above goal (72, goal <70).  From 0 few years prior.  Patient experienced muscle issues with Crestor 5mg  and Zetia (LDL was 72 on both). Family history of early CAD. -Order lipid panel and NMR profile today. -Consider switching from Crestor to PCSK9 inhibitor (Repatha) due to muscle side effects. Discuss with pharmacy team for preauthorization and patient education.  Atrial Fibrillation Less than 1% burden noted on monitor in 2021. No recent episodes reported. Currently on Cardizem CD 120mg  daily. -Continue current management.  Sleep Apnea Managed by Dr. Mayford Knife. No changes or  concerns discussed. -Continue current management.             Dispo: 1 yr  Signed, Donato Schultz, MD

## 2023-05-13 NOTE — Patient Instructions (Signed)
Medication Instructions:  The current medical regimen is effective;  continue present plan and medications.  *If you need a refill on your cardiac medications before your next appointment, please call your pharmacy*   Lab Work: Please have blood work today (MNR)  If you have labs (blood work) drawn today and your tests are completely normal, you will receive your results only by: MyChart Message (if you have MyChart) OR A paper copy in the mail If you have any lab test that is abnormal or we need to change your treatment, we will call you to review the results.  You have been referred to our Lipid Clinic here at [facility].   Follow-Up: At [facility], you and your health needs are our priority.  As part of our continuing mission to provide you with exceptional heart care, we have created designated Provider Care Teams.  These Care Teams include your primary Cardiologist (physician) and Advanced Practice Providers (APPs -  Physician Assistants and Nurse Practitioners) who all work together to provide you with the care you need, when you need it.  We recommend signing up for the patient portal called "MyChart".  Sign up information is provided on this After Visit Summary.  MyChart is used to connect with patients for Virtual Visits (Telemedicine).  Patients are able to view lab/test results, encounter notes, upcoming appointments, etc.  Non-urgent messages can be sent to your provider as well.   To learn more about what you can do with MyChart, go to [email or web address].    Your next appointment:   1 year(s)  Provider:   Donato Schultz, MD

## 2023-05-14 LAB — NMR, LIPOPROFILE
Cholesterol, Total: 181 mg/dL (ref 100–199)
HDL Particle Number: 31 umol/L (ref >=30.5)
HDL-C: 54 mg/dL (ref >39)
LDL Particle Number: 1503 nmol/L — ABNORMAL HIGH (ref <1000)
LDL Size: 20.5 nmol — ABNORMAL LOW (ref >20.5)
LDL-C (NIH Calc): 115 mg/dL — ABNORMAL HIGH (ref 0–99)
LP-IR Score: 43 (ref <=45)
Small LDL Particle Number: 739 nmol/L — ABNORMAL HIGH (ref <=527)
Triglycerides: 66 mg/dL (ref 0–149)

## 2023-05-20 ENCOUNTER — Other Ambulatory Visit (HOSPITAL_COMMUNITY): Payer: Self-pay

## 2023-05-20 ENCOUNTER — Telehealth: Payer: Self-pay | Admitting: Pharmacist

## 2023-05-20 ENCOUNTER — Ambulatory Visit: Payer: 59 | Attending: Cardiology | Admitting: Student

## 2023-05-20 ENCOUNTER — Encounter: Payer: Self-pay | Admitting: Student

## 2023-05-20 DIAGNOSIS — E7800 Pure hypercholesterolemia, unspecified: Secondary | ICD-10-CM

## 2023-05-20 MED ORDER — REPATHA SURECLICK 140 MG/ML ~~LOC~~ SOAJ
140.0000 mg | SUBCUTANEOUS | 3 refills | Status: DC
Start: 2023-05-20 — End: 2024-04-20

## 2023-05-20 NOTE — Addendum Note (Signed)
Addended by: Tylene Fantasia on: 05/20/2023 04:11 PM   Modules accepted: Orders

## 2023-05-20 NOTE — Patient Instructions (Signed)
Your Results:             Your most recent labs Goal  Total Cholesterol 181 < 200  Triglycerides 66 < 150  HDL (happy/good cholesterol) 54 > 40  LDL (lousy/bad cholesterol 115 < 70   Medication changes: start taking Crestor 5 mg 3 times per week. We will start the process to get PCSK9i (Repatha or Praluent)  covered by your insurance.  Once the prior authorization is complete, we will call you to let you know and confirm pharmacy information.      Praluent is a cholesterol medication that improved your body's ability to get rid of "bad cholesterol" known as LDL. It can lower your LDL up to 60%. It is an injection that is given under the skin every 2 weeks. The most common side effects of Praluent include runny nose, symptoms of the common cold, rarely flu or flu-like symptoms, back/muscle pain in about 3-4% of the patients, and redness, pain, or bruising at the injection site.    Repatha is a cholesterol medication that improved your body's ability to get rid of "bad cholesterol" known as LDL. It can lower your LDL up to 60%! It is an injection that is given under the skin every 2 weeks. The most common side effects of Repatha include runny nose, symptoms of the common cold, rarely flu or flu-like symptoms, back/muscle pain in about 3-4% of the patients, and redness, pain, or bruising at the injection site.   Lab orders: We want to repeat labs after 2-3 months.  We will send you a lab order to remind you once we get closer to that time.

## 2023-05-20 NOTE — Telephone Encounter (Signed)
Spoke to patient, will be starting Repatha from this week. Follow up lipid lab will get done end of January

## 2023-05-20 NOTE — Assessment & Plan Note (Addendum)
Assessment: Last LDLc 115 mg/dl above the goal (<16 mg/dl)  Currently on  5 mg rosuvastatin  - unable to tolerate due to muscle cramps  Unable to tolerate 10 mg daily dose of Crestor and Zetia - muscle cramps  Discussed other LDLc lowering agents - PCSK9i, Inclisiran and bempedoic acid   Plan  Reduce rosuvastatin dose to 5 mg 3 times per week  Will apply for prior authorization for PCSK9i  Follow up lipid lab in 2-3 months of stating PCSK9i

## 2023-05-20 NOTE — Telephone Encounter (Signed)
PA requested for PCSK9i.

## 2023-05-20 NOTE — Progress Notes (Signed)
Patient ID: Jerry Barry                 DOB: 1960/10/27                    MRN: [id number]      HPI: Jerry Barry is a 62 y.o. male patient referred to lipid clinic by Dr.Skains. PMH is significant for PAF, OSA, BPH, family hx of early CAD, HLD.  Patient saw Dr.Skains on 05/13/2023 reported having muscle issue with Crestor and it worsen when he added Zetia so he had stopped taking Zetia. He showed interest in trying other statin with fewer muscle issues like Pravastatin.   Today patient presented in good spirit. Reports he get muscle cramps even at 5 mg daily dose of Crestor. He has been eating healthy whole food an exercises regularly 4 times per week. He has done own research on non-statin LDLc lowering agents and he likes to further discuss Repatha mainly side effects. We reviewed  PCSK-9 inhibitors both Repatha and Praluent, bempedoic acid and inclisiran.  Discussed mechanisms of action, dosing, side effects and potential decreases in LDL cholesterol.  Also reviewed cost information and potential options for patient assistance.   Current Medications: Crestor 5 mg daily Zetia 10 mg daily  Intolerances: Crestor 10 mg daily- myalgia Risk Factors: elevated CAC score 331, 82nd percentile, CAD,  LDL goal: <70 mg/dl  Last lab LDLc :782 HDL 54, TG 66, TC 181  Diet: whole food, heart healthy diet   Exercise: 4 times cardio- walking 4 miles every other day  and weights - 3 times week   Family History:  Relation Problem Comments  Mother (Deceased) Colitis   Heart disease stent    Father (Deceased) Benign prostatic hyperplasia   Colon polyps   Diabetes     Sister Metallurgist) Colon polyps     Brother (Alive) Colon polyps     Social History:  Alcohol: twice a month 1-2 drinks Smoking: never  Labs: Lipid Panel     Component Value Date/Time   CHOL 126 09/13/2022 0842   TRIG 55 09/13/2022 0842   HDL 42 09/13/2022 0842   CHOLHDL 3.0 09/13/2022 0842   LDLCALC 72 09/13/2022 0842    LABVLDL 12 09/13/2022 0842    Past Medical History:  Diagnosis Date   Colon polyps    Diverticulosis    Esophageal stricture    GERD (gastroesophageal reflux disease)    Hemorrhoids    Hiatal hernia    Hyperlipidemia    Obesity (BMI 30-39.9) 06/17/2015   OSA (obstructive sleep apnea) 10/04/2014   Mild with AHI 7.7/hr   PVC (premature ventricular contraction)    Sleep apnea     Current Outpatient Medications on File Prior to Visit  Medication Sig Dispense Refill   aspirin 81 MG chewable tablet Chew 81 mg by mouth daily.     co-enzyme Q-10 30 MG capsule Take 30 mg by mouth 3 (three) times daily.     diltiazem (CARDIZEM CD) 120 MG 24 hr capsule TAKE 1 CAPSULE BY MOUTH DAILY. PLEASE KEEP SCHEDULED APPOINTMENT FOR FUTURE REFILLS. THANK YOU. 30 capsule 0   ezetimibe (ZETIA) 10 MG tablet Take 1 tablet (10 mg total) by mouth daily. (Patient not taking: Reported on 05/13/2023) 90 tablet 3   hydrocortisone 2.5 % cream Apply topically 2 (two) times daily.     iron polysaccharides (NIFEREX) 150 MG capsule Take 150 mg by mouth daily.     magnesium (MAGTAB)  84 MG ( ) TBCR SR tablet Take 84 mg by mouth.     MULTIPLE VITAMIN PO Take by mouth.     omeprazole (PRILOSEC) 20 MG capsule Take 1 capsule (20 mg total) by mouth daily. 90 capsule 3   rosuvastatin (CRESTOR) 10 MG tablet Take 1 tablet (10 mg total) by mouth daily. (Patient taking differently: Take 5 mg by mouth daily.) 90 tablet 0   Saw Palmetto, Serenoa repens, (SAW PALMETTO PO) Take 160 mg by mouth daily.     Vitamin D, Ergocalciferol, (DRISDOL) 1.25 MG (50000 UNIT) CAPS capsule Take 50,000 Units by mouth every 7 (seven) days.     No current facility-administered medications on file prior to visit.    No Known Allergies    1. Hyperlipidemia -  Problem  Hypercholesterolemia   Current Medications: Crestor 5 mg daily  Intolerances: Crestor 10 mg daily, Zetia  and Crestor 5 mg daily- myalgia Risk Factors: elevated CAC score 331,  82nd percentile, CAD,  LDL goal: <70 mg/dl  Last lab LDLc :956 HDL 54, TG 66, TC 181 on Crestor 5 mg daily       Hypercholesterolemia Assessment: Last LDLc 115 mg/dl above the goal (<38 mg/dl)  Currently on  5 mg rosuvastatin  - unable to tolerate due to muscle cramps  Unable to tolerate 10 mg daily dose of Crestor and Zetia - muscle cramps  Discussed other LDLc lowering agents - PCSK9i, Inclisiran and bempedoic acid   Plan  Reduce rosuvastatin dose to 5 mg 3 times per week  Will apply for prior authorization for PCSK9i  Follow up lipid lab in 2-3 months of stating PCSK9i    Thank you,  Carmela Hurt, Pharm.D [facility] A Division of [facility] [address] [place], [place] [address]  Phone: [phone]; Fax: [phone]

## 2023-06-17 ENCOUNTER — Encounter: Payer: Self-pay | Admitting: Family Medicine

## 2023-06-17 ENCOUNTER — Ambulatory Visit: Payer: 59 | Admitting: Family Medicine

## 2023-06-17 VITALS — BP 126/84 | Ht 74.0 in | Wt 230.0 lb

## 2023-06-17 DIAGNOSIS — M722 Plantar fascial fibromatosis: Secondary | ICD-10-CM

## 2023-06-17 DIAGNOSIS — R269 Unspecified abnormalities of gait and mobility: Secondary | ICD-10-CM

## 2023-06-17 DIAGNOSIS — M79604 Pain in right leg: Secondary | ICD-10-CM | POA: Diagnosis not present

## 2023-06-17 DIAGNOSIS — M79605 Pain in left leg: Secondary | ICD-10-CM | POA: Diagnosis not present

## 2023-06-17 DIAGNOSIS — M79672 Pain in left foot: Secondary | ICD-10-CM

## 2023-06-17 DIAGNOSIS — M79671 Pain in right foot: Secondary | ICD-10-CM

## 2023-06-17 NOTE — Progress Notes (Signed)
DATE OF VISIT: 06/17/2023        Jerry Barry DOB: 02/14/1961 MRN: [id number]  CC:  b/l foot and leg pain  History of present Illness: Jerry Barry is a 62 y.o. male who presents for orthotic evaluation - referred by Thereasa Distance, chiropractor  B/L foot and leg pain for about 2 years Worse with standing for long periods - retired, works on classic cars - notes increased symptoms when standing in garage for long periods Pain starts in feet and radiates up the legs No numbness/tingling Denies lower ext weakness Has tried OTC orthotics, but they typically wear out after 5-6 months Dr Vear Clock thought he would benefit from custom orthotics  Medications:  Outpatient Encounter Medications as of 06/17/2023  Medication Sig   aspirin 81 MG chewable tablet Chew 81 mg by mouth daily.   co-enzyme Q-10 30 MG capsule Take 30 mg by mouth 3 (three) times daily.   diltiazem (CARDIZEM CD) 120 MG 24 hr capsule TAKE 1 CAPSULE BY MOUTH DAILY. PLEASE KEEP SCHEDULED APPOINTMENT FOR FUTURE REFILLS. THANK YOU.   Evolocumab (REPATHA SURECLICK) 140 MG/ML SOAJ Inject 140 mg into the skin every 14 (fourteen) days.   ezetimibe (ZETIA) 10 MG tablet Take 1 tablet (10 mg total) by mouth daily. (Patient not taking: Reported on 05/13/2023)   hydrocortisone 2.5 % cream Apply topically 2 (two) times daily.   iron polysaccharides (NIFEREX) 150 MG capsule Take 150 mg by mouth daily.   magnesium (MAGTAB) 84 MG ( ) TBCR SR tablet Take 84 mg by mouth.   MULTIPLE VITAMIN PO Take by mouth.   omeprazole (PRILOSEC) 20 MG capsule Take 1 capsule (20 mg total) by mouth daily.   rosuvastatin (CRESTOR) 10 MG tablet Take 1 tablet (10 mg total) by mouth daily. (Patient taking differently: Take 5 mg by mouth daily.)   Saw Palmetto, Serenoa repens, (SAW PALMETTO PO) Take 160 mg by mouth daily.   Vitamin D, Ergocalciferol, (DRISDOL) 1.25 MG (50000 UNIT) CAPS capsule Take 50,000 Units by mouth every 7 (seven) days.   No  facility-administered encounter medications on file as of 06/17/2023.    Allergies: has No Known Allergies.  Physical Examination: Vitals: BP 126/84   Ht 6\' 2"  (1.88 m)   Wt 230 lb (104.3 kg)   BMI 29.53 kg/m  GENERAL:  Jerry Barry is a 62 y.o. male appearing their stated age, alert and oriented x 3, in no apparent distress.  SKIN: no rashes or lesions, skin clean, dry, intact MSK:  B/L feet with slight long arch collapse with overpronation (Rt>Lt), b/l transverse arch collapse with widening of forefoot b/l.  B/l 4th and 5th MT insufficiency.  Small palpable plantar fibroma noted on left plantar aspect of the foot, no increased redness or warmth.  Rt 2nd toe with small soft tissue mass, no increased redness or warmth, no increased pain. Good hip ROM b/l Normal gross lower ext strength b/l No leg length difference Gait: overpronation b/l with walking NEURO: sensation intact to light touch VASC: pulses 2+ and symmetric lower ext bilaterally, no edema  Assessment & Plan Bilateral leg and foot pain Bilateral foot and leg pain likely related to arch collapse and gait abnormality - limited improvement with prior OTC orthotics  PLAN: - candidate for custom orthotics - reviewed costs & benefits.  He would like to proceed today - orthotics fabricated as noted below - they were comfortable in the office and he had neutral gait with orthotics - he is encouraged  to reach out with any questions or concerns Abnormality of gait Bilateral foot and leg pain likely related to arch collapse and gait abnormality - limited improvement with prior OTC orthotics  PLAN: - candidate for custom orthotics - reviewed costs & benefits.  He would like to proceed today - orthotics fabricated as noted below - they were comfortable in the office and he had neutral gait with orthotics - he is encouraged to reach out with any questions or concerns Plantar fascial fibromatosis of left foot Small plantar fibroma  on the left foot - currently asymptomatic, but prior OTC orthotics have caused some irritation  PLAN: - orthotics fabricated as noted below - they were comfortable in the office and he had neutral gait with orthotics - he is encouraged to reach out with any questions or concerns - could consider injection or shockwave if becomes symptomatic  CUSTOM ORTHOTICS: Patient was fitted for a : Fit 'n Run semi-rigid orthotic  The orthotic was heated, placed on the orthotic stand. The patient was positioned in subtalar neutral position and 10 degrees of ankle dorsiflexion in a weight bearing stance on the heated orthotic blank After completion of molding Blank: Fit 'n Run - size 12 Posting:  b/l metatarsal cookies Base: none  Orthotics were comfortable in the office and he had neutral gait with orthotics.  Encounter Diagnoses  Name Primary?   Bilateral leg and foot pain Yes   Abnormality of gait    Plantar fascial fibromatosis of left foot     No orders of the defined types were placed in this encounter.

## 2023-06-22 ENCOUNTER — Other Ambulatory Visit: Payer: Self-pay | Admitting: Cardiology

## 2023-07-23 ENCOUNTER — Other Ambulatory Visit: Payer: Self-pay | Admitting: Cardiology

## 2023-08-29 ENCOUNTER — Telehealth: Payer: Self-pay | Admitting: Pharmacist

## 2023-08-29 NOTE — Telephone Encounter (Signed)
Call to remind patient about f/u lipid lab. Patient will be going for lab on Feb 4.

## 2023-09-03 LAB — LIPID PANEL
Chol/HDL Ratio: 2.2 {ratio} (ref 0.0–5.0)
Cholesterol, Total: 113 mg/dL (ref 100–199)
HDL: 51 mg/dL (ref >39)
LDL Chol Calc (NIH): 49 mg/dL (ref 0–99)
Triglycerides: 55 mg/dL (ref 0–149)
VLDL Cholesterol Cal: 13 mg/dL (ref 5–40)

## 2023-09-04 ENCOUNTER — Telehealth: Payer: Self-pay | Admitting: Pharmacist

## 2023-09-04 NOTE — Telephone Encounter (Signed)
 Spoke to pt. Currently taking only Repatha  140 mg Q14 days. LDLc at goal advised to continue with Repatha .

## 2023-09-05 ENCOUNTER — Encounter: Payer: Self-pay | Admitting: Cardiology

## 2024-02-01 ENCOUNTER — Other Ambulatory Visit: Payer: Self-pay | Admitting: Internal Medicine

## 2024-02-01 DIAGNOSIS — K219 Gastro-esophageal reflux disease without esophagitis: Secondary | ICD-10-CM

## 2024-02-26 ENCOUNTER — Other Ambulatory Visit: Payer: Self-pay | Admitting: Internal Medicine

## 2024-02-26 DIAGNOSIS — K219 Gastro-esophageal reflux disease without esophagitis: Secondary | ICD-10-CM

## 2024-03-24 ENCOUNTER — Other Ambulatory Visit: Payer: Self-pay | Admitting: Internal Medicine

## 2024-03-24 DIAGNOSIS — K219 Gastro-esophageal reflux disease without esophagitis: Secondary | ICD-10-CM

## 2024-04-18 ENCOUNTER — Other Ambulatory Visit: Payer: Self-pay | Admitting: Cardiology

## 2024-04-18 DIAGNOSIS — E7800 Pure hypercholesterolemia, unspecified: Secondary | ICD-10-CM

## 2024-06-14 ENCOUNTER — Other Ambulatory Visit: Payer: Self-pay | Admitting: Internal Medicine

## 2024-06-14 DIAGNOSIS — K219 Gastro-esophageal reflux disease without esophagitis: Secondary | ICD-10-CM

## 2024-07-15 ENCOUNTER — Other Ambulatory Visit: Payer: Self-pay | Admitting: Cardiology

## 2024-07-27 ENCOUNTER — Telehealth: Payer: Self-pay | Admitting: Internal Medicine

## 2024-07-27 DIAGNOSIS — K219 Gastro-esophageal reflux disease without esophagitis: Secondary | ICD-10-CM

## 2024-07-27 NOTE — Telephone Encounter (Signed)
 PT is calling to get a refill for omeprazole . He has scheduled at [facility] for 2/18. Medication should be sent to [facility] on [address].

## 2024-07-28 MED ORDER — OMEPRAZOLE 20 MG PO CPDR: 20.0000 mg | DELAYED_RELEASE_CAPSULE | Freq: Every day | ORAL | 0 refills | Status: AC

## 2024-07-28 NOTE — Telephone Encounter (Signed)
Prevacid refilled 

## 2024-08-07 ENCOUNTER — Other Ambulatory Visit: Payer: Self-pay | Admitting: Cardiology

## 2024-08-18 ENCOUNTER — Other Ambulatory Visit: Payer: Self-pay | Admitting: Cardiology

## 2024-08-28 ENCOUNTER — Other Ambulatory Visit: Payer: Self-pay | Admitting: Cardiology

## 2024-09-16 ENCOUNTER — Ambulatory Visit: Admitting: Internal Medicine
